# Patient Record
Sex: Female | Born: 1965 | Race: White | Hispanic: No | Marital: Married | Smoking: Former smoker
Health system: Southern US, Community
[De-identification: ages and names within clinical notes are randomized; demographics above are authoritative.]

## PROBLEM LIST (undated history)

## (undated) DIAGNOSIS — K222 Esophageal obstruction: Secondary | ICD-10-CM

## (undated) DIAGNOSIS — D039 Melanoma in situ, unspecified: Secondary | ICD-10-CM

## (undated) DIAGNOSIS — K279 Peptic ulcer, site unspecified, unspecified as acute or chronic, without hemorrhage or perforation: Secondary | ICD-10-CM

## (undated) DIAGNOSIS — K5790 Diverticulosis of intestine, part unspecified, without perforation or abscess without bleeding: Secondary | ICD-10-CM

## (undated) DIAGNOSIS — D229 Melanocytic nevi, unspecified: Secondary | ICD-10-CM

## (undated) DIAGNOSIS — F419 Anxiety disorder, unspecified: Secondary | ICD-10-CM

## (undated) DIAGNOSIS — G4700 Insomnia, unspecified: Secondary | ICD-10-CM

## (undated) DIAGNOSIS — K449 Diaphragmatic hernia without obstruction or gangrene: Secondary | ICD-10-CM

## (undated) DIAGNOSIS — F32A Depression, unspecified: Secondary | ICD-10-CM

## (undated) DIAGNOSIS — K5000 Crohn's disease of small intestine without complications: Secondary | ICD-10-CM

## (undated) DIAGNOSIS — K311 Adult hypertrophic pyloric stenosis: Secondary | ICD-10-CM

## (undated) DIAGNOSIS — F329 Major depressive disorder, single episode, unspecified: Secondary | ICD-10-CM

## (undated) DIAGNOSIS — C439 Malignant melanoma of skin, unspecified: Secondary | ICD-10-CM

## (undated) DIAGNOSIS — K219 Gastro-esophageal reflux disease without esophagitis: Secondary | ICD-10-CM

## (undated) HISTORY — DX: Diverticulosis of intestine, part unspecified, without perforation or abscess without bleeding: K57.90

## (undated) HISTORY — DX: Crohn's disease of small intestine without complications: K50.00

## (undated) HISTORY — DX: Peptic ulcer, site unspecified, unspecified as acute or chronic, without hemorrhage or perforation: K27.9

## (undated) HISTORY — DX: Adult hypertrophic pyloric stenosis: K31.1

## (undated) HISTORY — DX: Diaphragmatic hernia without obstruction or gangrene: K44.9

## (undated) HISTORY — DX: Anxiety disorder, unspecified: F41.9

## (undated) HISTORY — DX: Esophageal obstruction: K22.2

## (undated) HISTORY — PX: TUBAL LIGATION: SHX77

## (undated) HISTORY — DX: Malignant melanoma of skin, unspecified: C43.9

## (undated) HISTORY — PX: CHOLECYSTECTOMY: SHX55

---
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
## 2003-12-19 DIAGNOSIS — D229 Melanocytic nevi, unspecified: Secondary | ICD-10-CM

## 2003-12-19 HISTORY — DX: Melanocytic nevi, unspecified: D22.9

## 2005-06-27 ENCOUNTER — Ambulatory Visit (HOSPITAL_COMMUNITY): Admission: RE | Admit: 2005-06-27 | Discharge: 2005-06-27 | Payer: Self-pay | Admitting: Orthopaedic Surgery

## 2008-07-01 ENCOUNTER — Ambulatory Visit (HOSPITAL_COMMUNITY): Admission: RE | Admit: 2008-07-01 | Discharge: 2008-07-01 | Payer: Self-pay | Admitting: Obstetrics and Gynecology

## 2013-01-23 DIAGNOSIS — K5000 Crohn's disease of small intestine without complications: Secondary | ICD-10-CM | POA: Insufficient documentation

## 2013-01-23 HISTORY — DX: Crohn's disease of small intestine without complications: K50.00

## 2013-02-14 ENCOUNTER — Encounter (HOSPITAL_COMMUNITY): Payer: Self-pay | Admitting: Emergency Medicine

## 2013-02-14 ENCOUNTER — Emergency Department (HOSPITAL_COMMUNITY): Payer: BC Managed Care – PPO

## 2013-02-14 ENCOUNTER — Inpatient Hospital Stay (HOSPITAL_COMMUNITY)
Admission: EM | Admit: 2013-02-14 | Discharge: 2013-02-16 | DRG: 387 | Disposition: A | Discharge status: Home / Self Care | Payer: BC Managed Care – PPO | Attending: Internal Medicine | Admitting: Internal Medicine

## 2013-02-14 DIAGNOSIS — F329 Major depressive disorder, single episode, unspecified: Secondary | ICD-10-CM | POA: Diagnosis present

## 2013-02-14 DIAGNOSIS — F17200 Nicotine dependence, unspecified, uncomplicated: Secondary | ICD-10-CM | POA: Diagnosis present

## 2013-02-14 DIAGNOSIS — N858 Other specified noninflammatory disorders of uterus: Secondary | ICD-10-CM | POA: Diagnosis present

## 2013-02-14 DIAGNOSIS — E876 Hypokalemia: Secondary | ICD-10-CM | POA: Diagnosis present

## 2013-02-14 DIAGNOSIS — K219 Gastro-esophageal reflux disease without esophagitis: Secondary | ICD-10-CM | POA: Diagnosis present

## 2013-02-14 DIAGNOSIS — F411 Generalized anxiety disorder: Secondary | ICD-10-CM | POA: Diagnosis present

## 2013-02-14 DIAGNOSIS — D259 Leiomyoma of uterus, unspecified: Secondary | ICD-10-CM | POA: Diagnosis present

## 2013-02-14 DIAGNOSIS — K529 Noninfective gastroenteritis and colitis, unspecified: Secondary | ICD-10-CM | POA: Diagnosis present

## 2013-02-14 DIAGNOSIS — K5000 Crohn's disease of small intestine without complications: Principal | ICD-10-CM | POA: Diagnosis present

## 2013-02-14 DIAGNOSIS — F3289 Other specified depressive episodes: Secondary | ICD-10-CM | POA: Diagnosis present

## 2013-02-14 DIAGNOSIS — D251 Intramural leiomyoma of uterus: Secondary | ICD-10-CM | POA: Diagnosis present

## 2013-02-14 DIAGNOSIS — K5090 Crohn's disease, unspecified, without complications: Secondary | ICD-10-CM

## 2013-02-14 DIAGNOSIS — K5289 Other specified noninfective gastroenteritis and colitis: Secondary | ICD-10-CM

## 2013-02-14 HISTORY — DX: Major depressive disorder, single episode, unspecified: F32.9

## 2013-02-14 HISTORY — DX: Gastro-esophageal reflux disease without esophagitis: K21.9

## 2013-02-14 HISTORY — DX: Depression, unspecified: F32.A

## 2013-02-14 LAB — URINALYSIS, ROUTINE W REFLEX MICROSCOPIC
Bilirubin Urine: NEGATIVE
Glucose, UA: NEGATIVE mg/dL
Ketones, ur: NEGATIVE mg/dL
Leukocytes, UA: NEGATIVE
Nitrite: NEGATIVE
Urobilinogen, UA: 0.2 mg/dL (ref 0.0–1.0)
pH: 6 (ref 5.0–8.0)

## 2013-02-14 LAB — BASIC METABOLIC PANEL
BUN: 10 mg/dL (ref 6–23)
Creatinine, Ser: 0.9 mg/dL (ref 0.50–1.10)
GFR calc Af Amer: 87 mL/min — ABNORMAL LOW (ref >90)
Potassium: 3.3 mEq/L — ABNORMAL LOW (ref 3.5–5.1)

## 2013-02-14 LAB — CBC
HCT: 37.7 % (ref 36.0–46.0)
MCH: 29.8 pg (ref 26.0–34.0)
WBC: 9.7 10*3/uL (ref 4.0–10.5)

## 2013-02-14 LAB — URINE MICROSCOPIC-ADD ON

## 2013-02-14 MED ORDER — HYDROMORPHONE HCL PF 1 MG/ML IJ SOLN
1.0000 mg | Freq: Once | INTRAMUSCULAR | Status: AC
  Administered 2013-02-14: 1 mg via INTRAVENOUS
  Filled 2013-02-14: qty 1

## 2013-02-14 MED ORDER — IOHEXOL 300 MG/ML  SOLN
100.0000 mL | Freq: Once | INTRAMUSCULAR | Status: AC | PRN
  Administered 2013-02-14: 100 mL via INTRAVENOUS

## 2013-02-14 MED ORDER — IOHEXOL 300 MG/ML  SOLN
50.0000 mL | Freq: Once | INTRAMUSCULAR | Status: AC | PRN
  Administered 2013-02-14: 50 mL via ORAL

## 2013-02-14 MED ORDER — ONDANSETRON HCL 4 MG/2ML IJ SOLN
4.0000 mg | Freq: Once | INTRAMUSCULAR | Status: AC
  Administered 2013-02-14: 4 mg via INTRAVENOUS
  Filled 2013-02-14: qty 2

## 2013-02-14 NOTE — ED Notes (Signed)
Pain in right lower quadrant

## 2013-02-14 NOTE — ED Provider Notes (Signed)
CSN: [id number]     Arrival date & time 02/14/13  1549 History   This chart was scribed for Maudry Diego, MD by Jenne Campus, ED Scribe. This patient was seen in room APA14/APA14 and the patient's care was started at 6:18 PM.   Chief Complaint  Patient presents with  . Abdominal Pain    Patient is a 47 y.o. female presenting with abdominal pain. The history is provided by the patient. No language interpreter was used.  Abdominal Pain Pain location:  RLQ Pain quality: cramping   Pain radiates to:  Anus Onset quality:  Gradual Duration:  3 days Timing:  Intermittent Progression:  Worsening Chronicity:  New Relieved by:  Nothing Worsened by:  Position changes Ineffective treatments:  None tried Associated symptoms: no chest pain, no cough, no diarrhea, no fatigue, no fever, no hematuria and no nausea     HPI Comments: Renee Atkinson is a 47 y.o. female who presents to the Emergency Department complaining of 2 days of intermittent RLQ abdominal pain that radiates to the anus described as spasms. She states that the pain is worsened with changing positions and straining. She denies any associated fevers or nausea. She denies any prior abdominal surgeries. LNMP was 3 years ago.   PCP is Dr. Scotty Court  History reviewed. No pertinent past medical history. Past Surgical History  Procedure Laterality Date  . Cholecystectomy     History reviewed. No pertinent family history. History  Substance Use Topics  . Smoking status: Smoker, Current Status Unknown  . Smokeless tobacco: Not on file  . Alcohol Use: No   No OB history provided.  Review of Systems  Constitutional: Negative for fever, appetite change and fatigue.  HENT: Negative for congestion, ear discharge and sinus pressure.   Eyes: Negative for discharge.  Respiratory: Negative for cough.   Cardiovascular: Negative for chest pain.  Gastrointestinal: Positive for abdominal pain. Negative for nausea and diarrhea.   Genitourinary: Negative for frequency and hematuria.  Musculoskeletal: Negative for back pain.  Skin: Negative for rash.  Neurological: Negative for seizures and headaches.  Psychiatric/Behavioral: Negative for hallucinations.    Allergies  Morphine and related  Home Medications   Current Outpatient Rx  Name  Route  Sig  Dispense  Refill  . citalopram (CELEXA) 40 MG tablet   Oral   Take 40 mg by mouth at bedtime.         . clonazePAM (KLONOPIN) 0.5 MG tablet   Oral   Take 0.5 mg by mouth at bedtime.         Marland Kitchen esomeprazole (NEXIUM) 20 MG capsule   Oral   Take 20 mg by mouth at bedtime.         . fluticasone (FLONASE) 50 MCG/ACT nasal spray   Nasal   Place 2 sprays into the nose daily.          Triage Vitals: BP 115/81  Pulse 79  Temp(Src) 98.3 F (36.8 C) (Oral)  Resp 18  Ht 5' 4"  (1.626 m)  Wt 150 lb (68.04 kg)  BMI 25.73 kg/m2  SpO2 100%  Physical Exam  Nursing note and vitals reviewed. Constitutional: She is oriented to person, place, and time. She appears well-developed and well-nourished.  HENT:  Head: Normocephalic and atraumatic.  Eyes: Conjunctivae and EOM are normal. No scleral icterus.  Neck: Neck supple. No thyromegaly present.  Cardiovascular: Normal rate and regular rhythm.  Exam reveals no gallop and no friction rub.   No murmur  heard. Pulmonary/Chest: Effort normal. No stridor. She has no wheezes. She has no rales. She exhibits no tenderness.  Abdominal: She exhibits no distension. There is tenderness (moderate RLQ tenderness). There is no rebound.  Musculoskeletal: Normal range of motion. She exhibits no edema.  Lymphadenopathy:    She has no cervical adenopathy.  Neurological: She is alert and oriented to person, place, and time. She exhibits normal muscle tone. Coordination normal.  Skin: Skin is warm and dry. No rash noted. No erythema.  Psychiatric: She has a normal mood and affect. Her behavior is normal.    ED Course   Procedures (including critical care time)  Medications  HYDROmorphone (DILAUDID) injection 1 mg (not administered)  ondansetron (ZOFRAN) injection 4 mg (not administered)    DIAGNOSTIC STUDIES: Oxygen Saturation is 100% on room air, normal by my interpretation.    COORDINATION OF CARE: 6:20 PM-Discussed treatment plan which includes medications, CT of abdomen, CBC panel, BMP and UA with pt at bedside and pt agreed to plan.   10:09 PM-Pt rechecked and still complains of pain with medications listed above. Upon re-exam, pt still has tenderness to the RLQ. Informed pt of CT findings showing inflammed intestines and fibroids on the uterus. Discussed admission with pt and pt agreed.  10:13 PM-Consult complete with Dr. Darrick Meigs, Hospitalist. Patient case explained and discussed. Dr. Darrick Meigs agrees to admit patient for further evaluation and treatment. Call ended at 10:14 PM.  Labs Review Labs Reviewed  URINALYSIS, ROUTINE W REFLEX MICROSCOPIC - Abnormal; Notable for the following:    Color, Urine AMBER (*)    APPearance HAZY (*)    Specific Gravity, Urine >1.030 (*)    Hgb urine dipstick SMALL (*)    All other components within normal limits  BASIC METABOLIC PANEL - Abnormal; Notable for the following:    Potassium 3.3 (*)    GFR calc non Af Amer 75 (*)    GFR calc Af Amer 87 (*)    All other components within normal limits  URINE MICROSCOPIC-ADD ON - Abnormal; Notable for the following:    Squamous Epithelial / LPF FEW (*)    All other components within normal limits  CBC   Imaging Review Ct Abdomen Pelvis W Contrast  02/14/2013   CLINICAL DATA:  Abdominal pain  EXAM: CT ABDOMEN AND PELVIS WITH CONTRAST  TECHNIQUE: Multidetector CT imaging of the abdomen and pelvis was performed using the standard protocol following bolus administration of intravenous contrast.  CONTRAST:  82m OMNIPAQUE IOHEXOL 300 MG/ML SOLN, 1098mOMNIPAQUE IOHEXOL 300 MG/ML SOLN  COMPARISON:  None.  FINDINGS: The  terminal ileum is markedly thickened over approximately 15 cm length. Mild infiltration of the surrounding mesenteric fat. There is mild dilatation of the ileum proximal to the thickened loop. Remaining small bowel is not significantly dilated. FINDINGS most compatible with terminal ileitis, most likely Crohn's disease. No abscess is present. No free fluid.  Large irregular low-density mass involving the posterior fundus of the ureters. Endometrium is displaced anteriorly. This measures approximately 13 x 9 cm and is most likely a necrotic fibroid without calcification. This is touching the rectum and the bladder.  Lung bases are clear. Liver is normal. Cholecystectomy clips are present. Bile ducts nondilated. Pancreas spleen kidneys are normal.  IMPRESSION: Thickening of the terminal ileum, most consistent with Crohn's disease. No fistula or abscess is identified.  Large low-density irregular mass involving the uterus most likely a degenerating fibroid. Neoplasm not excluded.   Electronically Signed   By:  Franchot Gallo M.D.   On: 02/14/2013 20:43    EKG Interpretation   None       MDM  No diagnosis found.  The chart was scribed for me under my direct supervision.  I personally performed the history, physical, and medical decision making and all procedures in the evaluation of this patient.Maudry Diego, MD 02/14/13 2223

## 2013-02-14 NOTE — H&P (Signed)
PCP:   No primary provider on file.   Chief Complaint:  Abdominal pain  HPI: 47 year old female who came with two-day history of intermittent right lower quadrant abdominal pain which radiates to the anus and described as spasms. Pain is worsened with changing position and straining. Patient does not have any nausea or vomiting associated with it she denies any fever no history of previous inflammatory bowel disease. She denies chest pain or shortness of breath. She denies dysuria. Patient admits to having diarrhea and had 4 episodes of l loose bowel movements today which are watery she denies any blood in the stool, patient is in menopause, denies any vaginal bleeding. Patient's last menstrual period was 3 years ago Allergies:   Allergies  Allergen Reactions  . Morphine And Related Nausea And Vomiting     History reviewed. No pertinent past medical history.  Past Surgical History  Procedure Laterality Date  . Cholecystectomy      Prior to Admission medications   Medication Sig Start Date End Date Taking? Authorizing Provider  citalopram (CELEXA) 40 MG tablet Take 40 mg by mouth at bedtime.   Yes Historical Provider, MD  clonazePAM (KLONOPIN) 0.5 MG tablet Take 0.5 mg by mouth at bedtime.   Yes Historical Provider, MD  esomeprazole (NEXIUM) 20 MG capsule Take 20 mg by mouth at bedtime.   Yes Historical Provider, MD  fluticasone (FLONASE) 50 MCG/ACT nasal spray Place 2 sprays into the nose daily.   Yes Historical Provider, MD    Social History:  reports that she has been smoking.  She does not have any smokeless tobacco history on file. She reports that she does not drink alcohol. Her drug history is not on file.    All the positives are listed in BOLD  Review of Systems:  HEENT: Headache, blurred vision, runny nose, sore throat Neck: Hypothyroidism, hyperthyroidism,,lymphadenopathy Chest : Shortness of breath, history of COPD, Asthma Heart : Chest pain, history of coronary  arterey disease GI:  Nausea, vomiting, diarrhea, constipation, GERD GU: Dysuria, urgency, frequency of urination, hematuria Neuro: Stroke, seizures, syncope Psych: Depression, anxiety, hallucinations   Physical Exam: Blood pressure 109/74, pulse 78, temperature 98.6 F (37 C), temperature source Oral, resp. rate 20, height 5' 4"  (1.626 m), weight 68.04 kg (150 lb), SpO2 99.00%. Constitutional:   Patient is a well-developed and well-nourished female in no acute distress and cooperative with exam. Head: Normocephalic and atraumatic Mouth: Mucus membranes moist Eyes: PERRL, EOMI, conjunctivae normal Neck: Supple, No Thyromegaly Cardiovascular: RRR, S1 normal, S2 normal Pulmonary/Chest: CTAB, no wheezes, rales, or rhonchi Abdominal: Soft. Positive tenderness on palpation of right lower quadrant, non-distended, bowel sounds are normal, no masses, organomegaly, or guarding present.  Neurological: A&O x3, Strenght is normal and symmetric bilaterally, cranial nerve II-XII are grossly intact, no focal motor deficit, sensory intact to light touch bilaterally.  Extremities : No Cyanosis, Clubbing or Edema   Labs on Admission:  Results for orders placed during the hospital encounter of 02/14/13 (from the past 48 hour(s))  URINALYSIS, ROUTINE W REFLEX MICROSCOPIC     Status: Abnormal   Collection Time    02/14/13  4:06 PM      Result Value Range   Color, Urine AMBER (*) YELLOW   Comment: BIOCHEMICALS MAY BE AFFECTED BY COLOR   APPearance HAZY (*) CLEAR   Specific Gravity, Urine >1.030 (*) 1.005 - 1.030   pH 6.0  5.0 - 8.0   Glucose, UA NEGATIVE  NEGATIVE mg/dL   Hgb urine  dipstick SMALL (*) NEGATIVE   Bilirubin Urine NEGATIVE  NEGATIVE   Ketones, ur NEGATIVE  NEGATIVE mg/dL   Protein, ur NEGATIVE  NEGATIVE mg/dL   Urobilinogen, UA 0.2  0.0 - 1.0 mg/dL   Nitrite NEGATIVE  NEGATIVE   Leukocytes, UA NEGATIVE  NEGATIVE  URINE MICROSCOPIC-ADD ON     Status: Abnormal   Collection Time     02/14/13  4:06 PM      Result Value Range   Squamous Epithelial / LPF FEW (*) RARE   WBC, UA 0-2  <3 WBC/hpf   RBC / HPF 3-6  <3 RBC/hpf  CBC     Status: None   Collection Time    02/14/13  4:29 PM      Result Value Range   WBC 9.7  4.0 - 10.5 K/uL   RBC 4.19  3.87 - 5.11 MIL/uL   Hemoglobin 12.5  12.0 - 15.0 g/dL   HCT 37.7  36.0 - 46.0 %   MCV 90.0  78.0 - 100.0 fL   MCH 29.8  26.0 - 34.0 pg   MCHC 33.2  30.0 - 36.0 g/dL   RDW 14.4  11.5 - 15.5 %   Platelets 285  150 - 400 K/uL  BASIC METABOLIC PANEL     Status: Abnormal   Collection Time    02/14/13  4:29 PM      Result Value Range   Sodium 139  135 - 145 mEq/L   Potassium 3.3 (*) 3.5 - 5.1 mEq/L   Chloride 101  96 - 112 mEq/L   CO2 29  19 - 32 mEq/L   Glucose, Bld 90  70 - 99 mg/dL   BUN 10  6 - 23 mg/dL   Creatinine, Ser 0.90  0.50 - 1.10 mg/dL   Calcium 9.8  8.4 - 10.5 mg/dL   GFR calc non Af Amer 75 (*) >90 mL/min   GFR calc Af Amer 87 (*) >90 mL/min   Comment: (NOTE)     The eGFR has been calculated using the CKD EPI equation.     This calculation has not been validated in all clinical situations.     eGFR's persistently <90 mL/min signify possible Chronic Kidney     Disease.    Radiological Exams on Admission: Ct Abdomen Pelvis W Contrast  02/14/2013   CLINICAL DATA:  Abdominal pain  EXAM: CT ABDOMEN AND PELVIS WITH CONTRAST  TECHNIQUE: Multidetector CT imaging of the abdomen and pelvis was performed using the standard protocol following bolus administration of intravenous contrast.  CONTRAST:  25m OMNIPAQUE IOHEXOL 300 MG/ML SOLN, 1021mOMNIPAQUE IOHEXOL 300 MG/ML SOLN  COMPARISON:  None.  FINDINGS: The terminal ileum is markedly thickened over approximately 15 cm length. Mild infiltration of the surrounding mesenteric fat. There is mild dilatation of the ileum proximal to the thickened loop. Remaining small bowel is not significantly dilated. FINDINGS most compatible with terminal ileitis, most likely Crohn's  disease. No abscess is present. No free fluid.  Large irregular low-density mass involving the posterior fundus of the ureters. Endometrium is displaced anteriorly. This measures approximately 13 x 9 cm and is most likely a necrotic fibroid without calcification. This is touching the rectum and the bladder.  Lung bases are clear. Liver is normal. Cholecystectomy clips are present. Bile ducts nondilated. Pancreas spleen kidneys are normal.  IMPRESSION: Thickening of the terminal ileum, most consistent with Crohn's disease. No fistula or abscess is identified.  Large low-density irregular mass involving the uterus  most likely a degenerating fibroid. Neoplasm not excluded.   Electronically Signed   By: Franchot Gallo M.D.   On: 02/14/2013 20:43    Assessment/Plan Active Problems:   Ileitis   Fibroid uterus  Hypokalemia  47 year old female came with abdominal pain in the right lobe aren't CT of the abdomen showed thickening of the terminal ileum most consistent with Crohn's disease. I will start patient empirically on Cipro and Flagyl for possible inflammation of the ileum. We'll obtain GI consult in the morning for further evaluation for Crohn disease. Antibiotics can be stopped if no infectious etiology suspected as per GI. Due to diarrhea I will also check C. Difficile PCR. There is a large low-density irregular mass involving the uterus which most likely appears a degenerative fibroid, but neoplasm could not be excluded so I will obtain pelvic ultrasound to further assess the mass. Patient might need GYN evaluation as outpatient. We'll replace the potassium  Code status: Presumed full code  Family discussion: Discussed with husband at bedside   Time Spent on Admission: 60 min  [facility] Pager: [phone] 02/14/2013, 11:43 PM  If 7PM-7AM, please contact night-coverage  [email or web address]  Password [email or web address]

## 2013-02-15 ENCOUNTER — Inpatient Hospital Stay (HOSPITAL_COMMUNITY): Payer: BC Managed Care – PPO

## 2013-02-15 ENCOUNTER — Encounter (HOSPITAL_COMMUNITY): Payer: Self-pay | Admitting: *Deleted

## 2013-02-15 DIAGNOSIS — R933 Abnormal findings on diagnostic imaging of other parts of digestive tract: Secondary | ICD-10-CM

## 2013-02-15 DIAGNOSIS — R1011 Right upper quadrant pain: Secondary | ICD-10-CM

## 2013-02-15 LAB — CBC
HCT: 34.7 % — ABNORMAL LOW (ref 36.0–46.0)
Hemoglobin: 11.5 g/dL — ABNORMAL LOW (ref 12.0–15.0)
MCH: 30 pg (ref 26.0–34.0)
MCHC: 33.1 g/dL (ref 30.0–36.0)

## 2013-02-15 LAB — COMPREHENSIVE METABOLIC PANEL
ALT: 11 U/L (ref 0–35)
Albumin: 2.5 g/dL — ABNORMAL LOW (ref 3.5–5.2)
Alkaline Phosphatase: 47 U/L (ref 39–117)
BUN: 6 mg/dL (ref 6–23)
Calcium: 9.2 mg/dL (ref 8.4–10.5)
GFR calc Af Amer: 85 mL/min — ABNORMAL LOW (ref >90)
GFR calc non Af Amer: 73 mL/min — ABNORMAL LOW (ref >90)
Glucose, Bld: 111 mg/dL — ABNORMAL HIGH (ref 70–99)
Sodium: 142 mEq/L (ref 135–145)
Total Protein: 5.4 g/dL — ABNORMAL LOW (ref 6.0–8.3)

## 2013-02-15 LAB — SEDIMENTATION RATE: Sed Rate: 5 mm/hr (ref 0–22)

## 2013-02-15 MED ORDER — OXYCODONE-ACETAMINOPHEN 5-325 MG PO TABS
1.0000 | ORAL_TABLET | Freq: Four times a day (QID) | ORAL | Status: DC | PRN
  Administered 2013-02-15 (×2): 1 via ORAL
  Filled 2013-02-15 (×3): qty 1

## 2013-02-15 MED ORDER — ENOXAPARIN SODIUM 40 MG/0.4ML ~~LOC~~ SOLN: 40.0000 mg | SUBCUTANEOUS | Status: DC

## 2013-02-15 MED ORDER — METRONIDAZOLE IN NACL 5-0.79 MG/ML-% IV SOLN
500.0000 mg | Freq: Three times a day (TID) | INTRAVENOUS | Status: DC
  Filled 2013-02-15 (×9): qty 100

## 2013-02-15 MED ORDER — SODIUM CHLORIDE 0.9 % IV SOLN
INTRAVENOUS | Status: DC
  Administered 2013-02-15 (×2): via INTRAVENOUS

## 2013-02-15 MED ORDER — CITALOPRAM HYDROBROMIDE 20 MG PO TABS
40.0000 mg | ORAL_TABLET | Freq: Every day | ORAL | Status: DC
  Administered 2013-02-15 (×2): 40 mg via ORAL
  Filled 2013-02-15 (×2): qty 2

## 2013-02-15 MED ORDER — POTASSIUM CHLORIDE 10 MEQ/100ML IV SOLN
10.0000 meq | Freq: Once | INTRAVENOUS | Status: AC
  Administered 2013-02-15: 10 meq via INTRAVENOUS
  Filled 2013-02-15: qty 100

## 2013-02-15 MED ORDER — CIPROFLOXACIN IN D5W 400 MG/200ML IV SOLN
INTRAVENOUS | Status: AC
  Filled 2013-02-15: qty 200

## 2013-02-15 MED ORDER — METRONIDAZOLE IN NACL 5-0.79 MG/ML-% IV SOLN
500.0000 mg | Freq: Three times a day (TID) | INTRAVENOUS | Status: DC
  Administered 2013-02-15 – 2013-02-16 (×2): 500 mg via INTRAVENOUS
  Filled 2013-02-15 (×9): qty 100

## 2013-02-15 MED ORDER — METRONIDAZOLE IN NACL 5-0.79 MG/ML-% IV SOLN
500.0000 mg | Freq: Three times a day (TID) | INTRAVENOUS | Status: DC
  Administered 2013-02-15 (×2): 500 mg via INTRAVENOUS
  Filled 2013-02-15 (×11): qty 100

## 2013-02-15 MED ORDER — GADOBENATE DIMEGLUMINE 529 MG/ML IV SOLN
10.0000 mL | Freq: Once | INTRAVENOUS | Status: AC | PRN
  Administered 2013-02-15: 10 mL via INTRAVENOUS

## 2013-02-15 MED ORDER — CLONAZEPAM 0.5 MG PO TABS
0.5000 mg | ORAL_TABLET | Freq: Every day | ORAL | Status: DC
  Administered 2013-02-15 (×2): 0.5 mg via ORAL
  Filled 2013-02-15 (×2): qty 1

## 2013-02-15 MED ORDER — CIPROFLOXACIN IN D5W 400 MG/200ML IV SOLN
400.0000 mg | Freq: Two times a day (BID) | INTRAVENOUS | Status: DC
  Administered 2013-02-15 – 2013-02-16 (×3): 400 mg via INTRAVENOUS
  Filled 2013-02-15 (×8): qty 200

## 2013-02-15 MED ORDER — IBUPROFEN 800 MG PO TABS
400.0000 mg | ORAL_TABLET | Freq: Four times a day (QID) | ORAL | Status: DC | PRN
  Administered 2013-02-15: 400 mg via ORAL
  Filled 2013-02-15: qty 1

## 2013-02-15 MED ORDER — METRONIDAZOLE IN NACL 5-0.79 MG/ML-% IV SOLN
INTRAVENOUS | Status: AC
  Filled 2013-02-15: qty 100

## 2013-02-15 MED ORDER — ACETAMINOPHEN 325 MG PO TABS
650.0000 mg | ORAL_TABLET | Freq: Four times a day (QID) | ORAL | Status: DC | PRN
  Administered 2013-02-15 (×2): 650 mg via ORAL
  Filled 2013-02-15 (×2): qty 2

## 2013-02-15 MED ORDER — HYDROMORPHONE HCL PF 1 MG/ML IJ SOLN
1.0000 mg | INTRAMUSCULAR | Status: DC | PRN
  Administered 2013-02-15: 1 mg via INTRAVENOUS
  Filled 2013-02-15: qty 1

## 2013-02-15 MED ORDER — FLUTICASONE PROPIONATE 50 MCG/ACT NA SUSP
2.0000 | Freq: Every day | NASAL | Status: DC
  Filled 2013-02-15: qty 16

## 2013-02-15 MED ORDER — PANTOPRAZOLE SODIUM 40 MG PO TBEC
40.0000 mg | DELAYED_RELEASE_TABLET | Freq: Every day | ORAL | Status: DC
  Administered 2013-02-15: 40 mg via ORAL
  Filled 2013-02-15: qty 1

## 2013-02-15 MED ORDER — ONDANSETRON HCL 4 MG/2ML IJ SOLN
4.0000 mg | Freq: Four times a day (QID) | INTRAMUSCULAR | Status: DC | PRN
  Administered 2013-02-15 (×2): 4 mg via INTRAVENOUS
  Filled 2013-02-15 (×2): qty 2

## 2013-02-15 NOTE — Progress Notes (Signed)
[facility] PROGRESS NOTE  Renee Atkinson TDV:[id number] DOB: 30-Dec-1965 DOA: 02/14/2013 PCP: No primary provider on file.  Assessment/Plan: 1. Terminal Ileitis -thickening/inflammation of large segment of TI -concerning for IBD like Crohns, infectious ileitis also possibility  -check ESR, Gi pathogen panel -continue empiric Abx-Cipro/Flagyl -start clears, IVF, supportive care -GI consult pending, dr.Fields notified -will need colonoscopy/biopsy of TI at some point  2. Large irregular uterine mass, likely fibroid noted on CT -will need outpatient GYN eval for this  3. Depression/Anxiety -continue Klonopin/celexa  Code Status: Full Family Communication: d/w husband at bedside Disposition Plan: home when improved   Consultants:  GI Dr.Fields  Antibiotics:  Cipro/FLagyl 10/23  HPI/Subjective: Pain improved, still having diarrhea, non bloody  Objective: Filed Vitals:   02/15/13 0519  BP: 128/87  Pulse: 84  Temp: 98.3 F (36.8 C)  Resp: 20    Intake/Output Summary (Last 24 hours) at 02/15/13 1100 Last data filed at 02/15/13 0519  Gross per 24 hour  Intake      0 ml  Output      0 ml  Net      0 ml   Filed Weights   02/14/13 1602 02/15/13 0028  Weight: 68.04 kg (150 lb) 55.4 kg (122 lb 2.2 oz)    Exam:   General: AAOx3, no distress  Cardiovascular: S1S2/RRR  Respiratory: CTAB  Abdomen: soft, slightly distended, BS present, non tender  Musculoskeletal: no edema c/c  Skin no rashes   Data Reviewed: Basic Metabolic Panel:  Recent Labs Lab 02/14/13 1629 02/15/13 0458  NA 139 142  K 3.3* 4.1  CL 101 107  CO2 29 31  GLUCOSE 90 111*  BUN 10 6  CREATININE 0.90 0.92  CALCIUM 9.8 9.2   Liver Function Tests:  Recent Labs Lab 02/15/13 0458  AST 13  ALT 11  ALKPHOS 47  BILITOT 0.4  PROT 5.4*  ALBUMIN 2.5*   No results found for this basename: LIPASE, AMYLASE,  in the last 168 hours No results found for this basename: AMMONIA,   in the last 168 hours CBC:  Recent Labs Lab 02/14/13 1629 02/15/13 0458  WBC 9.7 7.2  HGB 12.5 11.5*  HCT 37.7 34.7*  MCV 90.0 90.6  PLT 285 254   Cardiac Enzymes: No results found for this basename: CKTOTAL, CKMB, CKMBINDEX, TROPONINI,  in the last 168 hours BNP (last 3 results) No results found for this basename: PROBNP,  in the last 8760 hours CBG: No results found for this basename: GLUCAP,  in the last 168 hours  Recent Results (from the past 240 hour(s))  CLOSTRIDIUM DIFFICILE BY PCR     Status: None   Collection Time    02/15/13  2:52 AM      Result Value Range Status   C difficile by pcr NEGATIVE  NEGATIVE Final     Studies: Ct Abdomen Pelvis W Contrast  02/14/2013   CLINICAL DATA:  Abdominal pain  EXAM: CT ABDOMEN AND PELVIS WITH CONTRAST  TECHNIQUE: Multidetector CT imaging of the abdomen and pelvis was performed using the standard protocol following bolus administration of intravenous contrast.  CONTRAST:  47m OMNIPAQUE IOHEXOL 300 MG/ML SOLN, 1073mOMNIPAQUE IOHEXOL 300 MG/ML SOLN  COMPARISON:  None.  FINDINGS: The terminal ileum is markedly thickened over approximately 15 cm length. Mild infiltration of the surrounding mesenteric fat. There is mild dilatation of the ileum proximal to the thickened loop. Remaining small bowel is not significantly dilated. FINDINGS most compatible with terminal ileitis,  most likely Crohn's disease. No abscess is present. No free fluid.  Large irregular low-density mass involving the posterior fundus of the ureters. Endometrium is displaced anteriorly. This measures approximately 13 x 9 cm and is most likely a necrotic fibroid without calcification. This is touching the rectum and the bladder.  Lung bases are clear. Liver is normal. Cholecystectomy clips are present. Bile ducts nondilated. Pancreas spleen kidneys are normal.  IMPRESSION: Thickening of the terminal ileum, most consistent with Crohn's disease. No fistula or abscess is  identified.  Large low-density irregular mass involving the uterus most likely a degenerating fibroid. Neoplasm not excluded.   Electronically Signed   By: Franchot Gallo M.D.   On: 02/14/2013 20:43    Scheduled Meds: . ciprofloxacin  400 mg Intravenous Q12H  . citalopram  40 mg Oral QHS  . clonazePAM  0.5 mg Oral QHS  . fluticasone  2 spray Each Nare Daily  . metronidazole  500 mg Intravenous Q8H   Continuous Infusions: . sodium chloride 75 mL/hr at 02/15/13 0308    Active Problems:   Ileitis   Fibroid uterus    Time spent: 22mn    [facility] Pager [phone] 7PM-7AM, please contact night-coverage at [email or web address], password [email or web address]10/24/2014, 11:00 AM  LOS: 1 day

## 2013-02-15 NOTE — Consult Note (Signed)
Referring Provider: Kathie Dike, MD Primary Care Physician:  No primary provider on file. Primary Gastroenterologist:  Barney Drain, MD  Reason for Consultation:  ?crohn's disease  HPI: Renee Atkinson is a 47 y.o. female present with two day h/o colicky RLQ pain. Developed diarrhea just prior to admission. 10+ watery nonbloody stools in the last 24 hours. Severe pain radiating from lower abdomen into anus with BMs. Denies fever, N/V.  Denies GI symptoms prior to this episode. Generally has normal BMs. Chronic GERD well-controlled. May 2014, sick with N/V/D while on cruise. Symptoms resolved without intervention. No prior colonoscopy. EGD OK, [facility], 10 years ago.  CT suspicious for Crohn's disease, send findings below. C.Diff PCR negative X 1. GI Pathogen panel pending.   Prior to Admission medications   Medication Sig Start Date End Date Taking? Authorizing Provider  citalopram (CELEXA) 40 MG tablet Take 40 mg by mouth at bedtime.   Yes Historical Provider, MD  clonazePAM (KLONOPIN) 0.5 MG tablet Take 0.5 mg by mouth at bedtime.   Yes Historical Provider, MD  esomeprazole (NEXIUM) 20 MG capsule Take 20 mg by mouth at bedtime.   Yes Historical Provider, MD  fluticasone (FLONASE) 50 MCG/ACT nasal spray Place 2 sprays into the nose daily.   Yes Historical Provider, MD    Current Facility-Administered Medications  Medication Dose Route Frequency Provider Last Rate Last Dose  . 0.9 %  sodium chloride infusion   Intravenous Continuous Oswald Hillock, MD 75 mL/hr at 02/15/13 0308    . acetaminophen (TYLENOL) tablet 650 mg  650 mg Oral Q6H PRN Oswald Hillock, MD   650 mg at 02/15/13 0946  . ciprofloxacin (CIPRO) IVPB 400 mg  400 mg Intravenous Q12H Oswald Hillock, MD   400 mg at 02/15/13 1226  . citalopram (CELEXA) tablet 40 mg  40 mg Oral QHS Oswald Hillock, MD   40 mg at 02/15/13 0120  . clonazePAM (KLONOPIN) tablet 0.5 mg  0.5 mg Oral QHS Oswald Hillock, MD   0.5 mg at 02/15/13 0120  . fluticasone  (FLONASE) 50 MCG/ACT nasal spray 2 spray  2 spray Each Nare Daily Oswald Hillock, MD      . HYDROmorphone (DILAUDID) injection 1 mg  1 mg Intravenous Q4H PRN Oswald Hillock, MD   1 mg at 02/15/13 0514  . ibuprofen (ADVIL,MOTRIN) tablet 400 mg  400 mg Oral Q6H PRN Domenic Polite, MD   400 mg at 02/15/13 1134  . metroNIDAZOLE (FLAGYL) IVPB 500 mg  500 mg Intravenous Q8H Oswald Hillock, MD   500 mg at 02/15/13 1136  . ondansetron (ZOFRAN) injection 4 mg  4 mg Intravenous Q6H PRN Oswald Hillock, MD   4 mg at 02/15/13 1133    Allergies as of 02/14/2013 - Review Complete 02/14/2013  Allergen Reaction Noted  . Morphine and related Nausea And Vomiting 02/14/2013    Past Medical History  Diagnosis Date  . Depression   . GERD (gastroesophageal reflux disease)     Past Surgical History  Procedure Laterality Date  . Cholecystectomy      Family History  Problem Relation Age of Onset  . Inflammatory bowel disease Neg Hx   . Colon cancer Neg Hx   . Prostate cancer Father   . Diverticulitis Mother     complicated, colostomy bag temporarily    History   Social History  . Marital Status: Married    Spouse Name: N/A    Number of Children:  2  . Years of Education: N/A   Occupational History  . Not on file.   Social History Main Topics  . Smoking status: Smoker, Current Status Unknown -- 0.50 packs/day    Types: Cigarettes  . Smokeless tobacco: Not on file  . Alcohol Use: No  . Drug Use: No  . Sexual Activity: Not on file   Other Topics Concern  . Not on file   Social History Narrative  . No narrative on file     ROS:  General: Negative for anorexia, weight loss, fever, chills, fatigue, weakness. Eyes: Negative for vision changes.  ENT: Negative for hoarseness, difficulty swallowing , nasal congestion. CV: Negative for chest pain, angina, palpitations, dyspnea on exertion, peripheral edema.  Respiratory: Negative for dyspnea at rest, dyspnea on exertion, cough, sputum, wheezing.   GI: See history of present illness. GU:  Negative for dysuria, hematuria, urinary incontinence, urinary frequency, nocturnal urination.  MS: Negative for joint pain, low back pain.  Derm: Negative for rash or itching.  Neuro: Negative for weakness, abnormal sensation, seizure, frequent headaches, memory loss, confusion.  Psych: Negative for anxiety, depression, suicidal ideation, hallucinations.  Endo: Negative for unusual weight change.  Heme: Negative for bruising or bleeding. Allergy: Negative for rash or hives.       Physical Examination: Vital signs in last 24 hours: Temp:  [98.3 F (36.8 C)-98.8 F (37.1 C)] 98.3 F (36.8 C) (10/24 0519) Pulse Rate:  [78-85] 84 (10/24 0519) Resp:  [18-20] 20 (10/24 0519) BP: (109-131)/(74-87) 128/87 mmHg (10/24 0519) SpO2:  [97 %-100 %] 98 % (10/24 0519) Weight:  [122 lb 2.2 oz (55.4 kg)-150 lb (68.04 kg)] 122 lb 2.2 oz (55.4 kg) (10/24 0028) Last BM Date: 02/15/13  General: Well-nourished, well-developed in no acute distress. Accompanied by daughter and husband.  Head: Normocephalic, atraumatic.   Eyes: Conjunctiva pink, no icterus. Mouth: Oropharyngeal mucosa moist and pink , no lesions erythema or exudate. Neck: Supple without thyromegaly, masses, or lymphadenopathy.  Lungs: Clear to auscultation bilaterally.  Heart: Regular rate and rhythm, no murmurs rubs or gallops.  Abdomen: Bowel sounds are normal, RLQ tenderness moderate, nondistended, no hepatosplenomegaly or masses, no abdominal bruits or    hernia , no rebound or guarding.   Rectal: not peformed Extremities: No lower extremity edema, clubbing, deformity.  Neuro: Alert and oriented x 4 , grossly normal neurologically.  Skin: Warm and dry, no rash or jaundice.   Psych: Alert and cooperative, normal mood and affect.        Intake/Output from previous day:   Intake/Output this shift: Total I/O In: 100 [IV Piggyback:100] Out: -   Lab Results: CBC  Recent Labs   02/14/13 1629 02/15/13 0458  WBC 9.7 7.2  HGB 12.5 11.5*  HCT 37.7 34.7*  MCV 90.0 90.6  PLT 285 254   BMET  Recent Labs  02/14/13 1629 02/15/13 0458  NA 139 142  K 3.3* 4.1  CL 101 107  CO2 29 31  GLUCOSE 90 111*  BUN 10 6  CREATININE 0.90 0.92  CALCIUM 9.8 9.2   LFT  Recent Labs  02/15/13 0458  BILITOT 0.4  ALKPHOS 47  AST 13  ALT 11  PROT 5.4*  ALBUMIN 2.5*   CDiff PCR negative.  ESR 5  PT/INR No results found for this basename: LABPROT, INR,  in the last 72 hours    Imaging Studies: Ct Abdomen Pelvis W Contrast  02/14/2013   CLINICAL DATA:  Abdominal pain  EXAM: CT  ABDOMEN AND PELVIS WITH CONTRAST  TECHNIQUE: Multidetector CT imaging of the abdomen and pelvis was performed using the standard protocol following bolus administration of intravenous contrast.  CONTRAST:  19m OMNIPAQUE IOHEXOL 300 MG/ML SOLN, 1045mOMNIPAQUE IOHEXOL 300 MG/ML SOLN  COMPARISON:  None.  FINDINGS: The terminal ileum is markedly thickened over approximately 15 cm length. Mild infiltration of the surrounding mesenteric fat. There is mild dilatation of the ileum proximal to the thickened loop. Remaining small bowel is not significantly dilated. FINDINGS most compatible with terminal ileitis, most likely Crohn's disease. No abscess is present. No free fluid.  Large irregular low-density mass involving the posterior fundus of the ureters. Endometrium is displaced anteriorly. This measures approximately 13 x 9 cm and is most likely a necrotic fibroid without calcification. This is touching the rectum and the bladder.  Lung bases are clear. Liver is normal. Cholecystectomy clips are present. Bile ducts nondilated. Pancreas spleen kidneys are normal.  IMPRESSION: Thickening of the terminal ileum, most consistent with Crohn's disease. No fistula or abscess is identified.  Large low-density irregular mass involving the uterus most likely a degenerating fibroid. Neoplasm not excluded.   Electronically  Signed   By: ChFranchot Gallo.D.   On: 02/14/2013 20:43  [4 week]   Impression: [age]/o female with acute onset RLQ pain associated with nonbloody diarrhea. CT showed markedly thickened terminal ileum over 15 cm length with mild infiltration of surrounding mesenteric fat, mild dilatation of ileum proximal to this loop. Findings may be secondary to Crohn's but given acuity of symptoms, infectious etiology does need to be excluded.   Plan: 1. Agree with Cipro/Flagyl. 2. Await GI pathogen panel. 3. If patient fails to improve, consider inpatient colonoscopy with terminal ileoscopy. 4. If patient improves with antibiotic therapy, she will need outpatient colonoscopy at a later date. 5. She complains of headache, ?due to dilaudid. Trial of percocet.  I would like to thank Dr. MeRoderic Palauor allowing us[place]oreao take part in the care of this nice patient.    LOS: 1 day   LeNeil Crouch10/24/2014, 1:29 PM

## 2013-02-15 NOTE — Care Management Note (Signed)
    Page 1 of 1   02/15/2013     1:40:30 PM   CARE MANAGEMENT NOTE 02/15/2013  Patient:  Renee Atkinson, Renee Atkinson   Account Number:  [id number]  Date Initiated:  02/15/2013  Documentation initiated by:  Theophilus Kinds  Subjective/Objective Assessment:   Pt admitted from home with ileitis. Pt lives with her husband and will return home at discharge. Pt is independent with ADL's.     Action/Plan:   No CM needs noted.   Anticipated DC Date:  02/18/2013   Anticipated DC Plan:  [place]  CM consult      Choice offered to / List presented to:             Status of service:  Completed, signed off Medicare Important Message given?   (If response is "NO", the following Medicare IM given date fields will be blank) Date Medicare IM given:   Date Additional Medicare IM given:    Discharge Disposition:  HOME/SELF CARE  Per UR Regulation:    If discussed at Long Length of Stay Meetings, dates discussed:    Comments:  02/15/13 [place], RN BSN CM

## 2013-02-15 NOTE — Progress Notes (Signed)
02/15/13 1620 Notified Dr. Roderic Palau of pelvic ultrasound results. Follow-up MRI ordered per Dr. [name]. Donavan Foil, RN

## 2013-02-15 NOTE — Progress Notes (Signed)
UR chart review completed.  

## 2013-02-15 NOTE — Progress Notes (Signed)
02/15/13 1237 Late entry for 1050. Patient c/o persistent headache, rated 8/10 on scale of 0-10 after receiving tylenol 650 mg po PRN for pain this morning. Notified Dr. Broadus John, order placed for ibuprofen PRN for pain. Patient states pain reduced to 6/10 on scale of 0-10 after ibuprofen. Tolerating clear liquids well at this time. No c/o abdominal pain or nausea at this time. Donavan Foil, RN

## 2013-02-15 NOTE — Progress Notes (Signed)
02/15/13 1848 patient aware of need for stool specimen for GI pathogen PCR ordered. Instructed to notify nursing staff of any further stools this morning. Stated would notify. No further stools this afternoon for specimen to be obtained. Nursing staff to monitor and send when available. Donavan Foil, RN

## 2013-02-15 NOTE — Consult Note (Addendum)
REVIEWED. AGREE. U/S-CYSTIC MASS. CT: THICKENED ILEUM/PELVIC MASS/NECROTIC LN ON CT NEAR ILEUM. AWAIT MRI PELVIS. ADVANCE DIET.

## 2013-02-16 DIAGNOSIS — N858 Other specified noninflammatory disorders of uterus: Secondary | ICD-10-CM | POA: Diagnosis present

## 2013-02-16 DIAGNOSIS — N9489 Other specified conditions associated with female genital organs and menstrual cycle: Secondary | ICD-10-CM

## 2013-02-16 LAB — CBC
Hemoglobin: 11.7 g/dL — ABNORMAL LOW (ref 12.0–15.0)
MCH: 30.1 pg (ref 26.0–34.0)
MCHC: 33.2 g/dL (ref 30.0–36.0)
RBC: 3.89 MIL/uL (ref 3.87–5.11)
RDW: 14.2 % (ref 11.5–15.5)

## 2013-02-16 LAB — BASIC METABOLIC PANEL
CO2: 30 mEq/L (ref 19–32)
GFR calc non Af Amer: 78 mL/min — ABNORMAL LOW (ref >90)
Glucose, Bld: 95 mg/dL (ref 70–99)
Potassium: 3.8 mEq/L (ref 3.5–5.1)
Sodium: 141 mEq/L (ref 135–145)

## 2013-02-16 MED ORDER — CIPROFLOXACIN HCL 500 MG PO TABS: 500.0000 mg | ORAL_TABLET | Freq: Two times a day (BID) | ORAL | Status: DC

## 2013-02-16 MED ORDER — METRONIDAZOLE 500 MG PO TABS: 500.0000 mg | ORAL_TABLET | Freq: Three times a day (TID) | ORAL | Status: DC

## 2013-02-16 MED ORDER — PROMETHAZINE HCL 25 MG/ML IJ SOLN
12.5000 mg | Freq: Four times a day (QID) | INTRAMUSCULAR | Status: DC | PRN
  Administered 2013-02-16: 12.5 mg via INTRAVENOUS
  Filled 2013-02-16: qty 1

## 2013-02-16 NOTE — Progress Notes (Signed)
[facility] PROGRESS NOTE  Renee Atkinson LKG:[id number] DOB: 1965-09-24 DOA: 02/14/2013 PCP: No primary provider on file.  Assessment/Plan: 1. Terminal Ileitis -thickening/inflammation of large segment of TI -concerning for IBD like Crohns, infectious ileitis also possibility  -ESR-5, Gi pathogen panel pending -clinically improved on Cipro/Flagyl -tolerating diet -will DC home today to FU with Gi Dr.FIelds for outpatient colonoscopy and terminal Ileoscopy -DC home today on PO CIpro/Flagyl pending GI eval  2. Large irregular uterine mass, most likely fibroid noted on CT -she will FU with GYN  3. Depression/Anxiety -continue Klonopin/celexa  Code Status: Full Family Communication: d/w husband at bedside Disposition Plan: home when improved   Consultants:  GI Dr.Fields  Antibiotics:  Cipro/FLagyl 10/23  HPI/Subjective: Pain resolved, no further diarrhea, tolerating diet  Objective: Filed Vitals:   02/16/13 0525  BP: 129/89  Pulse: 70  Temp: 98.2 F (36.8 C)  Resp: 18    Intake/Output Summary (Last 24 hours) at 02/16/13 1041 Last data filed at 02/16/13 0525  Gross per 24 hour  Intake   1810 ml  Output      0 ml  Net   1810 ml   Filed Weights   02/14/13 1602 02/15/13 0028  Weight: 68.04 kg (150 lb) 55.4 kg (122 lb 2.2 oz)    Exam:   General: AAOx3, no distress  Cardiovascular: S1S2/RRR  Respiratory: CTAB  Abdomen: soft, slightly distended, BS present, non tender  Musculoskeletal: no edema c/c  Skin no rashes   Data Reviewed: Basic Metabolic Panel:  Recent Labs Lab 02/14/13 1629 02/15/13 0458 02/16/13 0607  NA 139 142 141  K 3.3* 4.1 3.8  CL 101 107 105  CO2 29 31 30   GLUCOSE 90 111* 95  BUN 10 6 6   CREATININE 0.90 0.92 0.87  CALCIUM 9.8 9.2 9.1   Liver Function Tests:  Recent Labs Lab 02/15/13 0458  AST 13  ALT 11  ALKPHOS 47  BILITOT 0.4  PROT 5.4*  ALBUMIN 2.5*   No results found for this basename: LIPASE,  AMYLASE,  in the last 168 hours No results found for this basename: AMMONIA,  in the last 168 hours CBC:  Recent Labs Lab 02/14/13 1629 02/15/13 0458 02/16/13 0607  WBC 9.7 7.2 8.1  HGB 12.5 11.5* 11.7*  HCT 37.7 34.7* 35.2*  MCV 90.0 90.6 90.5  PLT 285 254 242   Cardiac Enzymes: No results found for this basename: CKTOTAL, CKMB, CKMBINDEX, TROPONINI,  in the last 168 hours BNP (last 3 results) No results found for this basename: PROBNP,  in the last 8760 hours CBG: No results found for this basename: GLUCAP,  in the last 168 hours  Recent Results (from the past 240 hour(s))  CLOSTRIDIUM DIFFICILE BY PCR     Status: None   Collection Time    02/15/13  2:52 AM      Result Value Range Status   C difficile by pcr NEGATIVE  NEGATIVE Final     Studies: Mr Pelvis W Wo Contrast  02/16/2013   CLINICAL DATA:  Cystic mass along the posterior uterus.  EXAM: MRI PELVIS WITHOUT AND WITH CONTRAST  TECHNIQUE: Multiplanar multisequence MR imaging of the pelvis was performed both before and after administration of intravenous contrast.  CONTRAST:  90m MULTIHANCE GADOBENATE DIMEGLUMINE 529 MG/ML IV SOLN  COMPARISON:  Multiple exams, including 02/15/2013  FINDINGS: 11.1 x 8.9 by 8.3 cm mass of the posterior margin of the uterus is observed. Given the appearance of myometrium extending along the  upper margin of this mass on image 16 of series 2, I suspect that the mass arises from the uterus and probably represents a very large and centrally necrotic fibroid. There is marginal soft tissue enhancement and mural irregularity along the inner margin of this necrotic mass.  A structure believed to represent the left ovary measures 2.1 by 1.1 by 2.2 cm on image 12 of series 3, and is separate from the mass. I suspect that the left ovary is just below the cecum, measuring about 2.5 x 1.6 by 1.2 cm.  There is abnormal fluid in the right pericolic gutter along with a thick-walled appearance of the terminal  ileum on coronal images.  Diffuse disk bulge with central annular tear at L5.  Urinary bladder unremarkable.  IMPRESSION: 1. Large necrotic mass along the posterior uterine margin. Statistically this is most likely to be a large predominantly necrotic intramural fibroid. On prior CT scan from 2006 per [facility], no report of a uterine abnormality is identified. Resection should be considered given its size and irregular enhancing margins to be sure that it represents a benign process. This appears to be separate from the ovaries and accordingly is unlikely to represent an ovarian malignancy. 2. Free fluid in the right pericolic gutter and trauma with inflamed appearing terminal ileum, query Crohn's disease.   Electronically Signed   By: Sherryl Barters M.D.   On: 02/16/2013 09:07   US Transvaginal Non-ob  02/15/2013   CLINICAL DATA:  Evaluate uterine mass.  Distended abdomen.  EXAM: TRANSABDOMINAL AND TRANSVAGINAL ULTRASOUND OF PELVIS  TECHNIQUE: Both transabdominal and transvaginal ultrasound examinations of the pelvis were performed. Transabdominal technique was performed for global imaging of the pelvis including uterus, ovaries, adnexal regions, and pelvic cul-de-sac. It was necessary to proceed with endovaginal exam following the transabdominal exam to visualize the uterus, endometrium and ovaries.  COMPARISON:  CT 02/14/2013  FINDINGS: Uterus  Measurements: A 15.4 x 9.7 x 9.2 cm. There is a complex markedly septated cystic mass noted along the posterior aspect of the uterus. This measures 11.0 x 7.3 x 8.6 cm. This appears to be within the posterior myometrium. Alternatively, this could be a large ovarian cystic mass displacing the uterus  Endometrium  Thickness: Displaced by the posterior mass, 4 mm in thickness. Limited visualization.  Right ovary  Measurements: Not visualized.  Left ovary  Measurements: Not visualized.  Other findings  Trace free fluid in the cul-de-sac  IMPRESSION: Large,  severely complex cystic mass in the region of the posterior uterus. This measures up to 11.0 cm. Extensive internal septations. Differential considerations would include a large degenerating fibroid, while the degree of septation is unusual for this. Also, this could be a large ovarian cystic mass/ neoplasm which is displacing the uterus anteriorly. This should be further evaluated with an MRI of the pelvis without and with contrast.   Electronically Signed   By: Rolm Baptise M.D.   On: 02/15/2013 15:33   US Pelvis Complete  02/15/2013   CLINICAL DATA:  Evaluate uterine mass.  Distended abdomen.  EXAM: TRANSABDOMINAL AND TRANSVAGINAL ULTRASOUND OF PELVIS  TECHNIQUE: Both transabdominal and transvaginal ultrasound examinations of the pelvis were performed. Transabdominal technique was performed for global imaging of the pelvis including uterus, ovaries, adnexal regions, and pelvic cul-de-sac. It was necessary to proceed with endovaginal exam following the transabdominal exam to visualize the uterus, endometrium and ovaries.  COMPARISON:  CT 02/14/2013  FINDINGS: Uterus  Measurements: A 15.4 x 9.7 x 9.2 cm.  There is a complex markedly septated cystic mass noted along the posterior aspect of the uterus. This measures 11.0 x 7.3 x 8.6 cm. This appears to be within the posterior myometrium. Alternatively, this could be a large ovarian cystic mass displacing the uterus  Endometrium  Thickness: Displaced by the posterior mass, 4 mm in thickness. Limited visualization.  Right ovary  Measurements: Not visualized.  Left ovary  Measurements: Not visualized.  Other findings  Trace free fluid in the cul-de-sac  IMPRESSION: Large, severely complex cystic mass in the region of the posterior uterus. This measures up to 11.0 cm. Extensive internal septations. Differential considerations would include a large degenerating fibroid, while the degree of septation is unusual for this. Also, this could be a large ovarian cystic mass/  neoplasm which is displacing the uterus anteriorly. This should be further evaluated with an MRI of the pelvis without and with contrast.   Electronically Signed   By: Rolm Baptise M.D.   On: 02/15/2013 15:33   Ct Abdomen Pelvis W Contrast  02/14/2013   CLINICAL DATA:  Abdominal pain  EXAM: CT ABDOMEN AND PELVIS WITH CONTRAST  TECHNIQUE: Multidetector CT imaging of the abdomen and pelvis was performed using the standard protocol following bolus administration of intravenous contrast.  CONTRAST:  49m OMNIPAQUE IOHEXOL 300 MG/ML SOLN, 1049mOMNIPAQUE IOHEXOL 300 MG/ML SOLN  COMPARISON:  None.  FINDINGS: The terminal ileum is markedly thickened over approximately 15 cm length. Mild infiltration of the surrounding mesenteric fat. There is mild dilatation of the ileum proximal to the thickened loop. Remaining small bowel is not significantly dilated. FINDINGS most compatible with terminal ileitis, most likely Crohn's disease. No abscess is present. No free fluid.  Large irregular low-density mass involving the posterior fundus of the ureters. Endometrium is displaced anteriorly. This measures approximately 13 x 9 cm and is most likely a necrotic fibroid without calcification. This is touching the rectum and the bladder.  Lung bases are clear. Liver is normal. Cholecystectomy clips are present. Bile ducts nondilated. Pancreas spleen kidneys are normal.  IMPRESSION: Thickening of the terminal ileum, most consistent with Crohn's disease. No fistula or abscess is identified.  Large low-density irregular mass involving the uterus most likely a degenerating fibroid. Neoplasm not excluded.   Electronically Signed   By: ChFranchot Gallo.D.   On: 02/14/2013 20:43    Scheduled Meds: . ciprofloxacin  400 mg Intravenous Q12H  . citalopram  40 mg Oral QHS  . clonazePAM  0.5 mg Oral QHS  . fluticasone  2 spray Each Nare Daily  . metronidazole  500 mg Intravenous Q8H  . pantoprazole  40 mg Oral QAC supper   Continuous  Infusions: . sodium chloride 75 mL/hr at 02/15/13 2256    Active Problems:   Ileitis   Fibroid uterus    Time spent: 2588m   Ashland Wiseman  [facility] Pager [phone] 7PM-7AM, please contact night-coverage at [email or web address], password [email or web address]/25/2014, 10:41 AM  LOS: 2 days

## 2013-02-16 NOTE — Discharge Summary (Signed)
Physician Discharge Summary  TYJAE SHVARTSMAN JZP:[id number] DOB: 03-Dec-1965 DOA: 02/14/2013  PCP: No primary provider on file.  Admit date: 02/14/2013 Discharge date: 02/16/2013  Time spent: 15mnutes  Recommendations for Outpatient Follow-up:  1. Dr.Sandi Fields, GI in 2 weeks 2. Outpatient Colonoscopy and terminal ileoscopy with Biopsy Fu with GYN in 2-3weeks for Uterine Mass   Discharge Diagnoses:    Ileitis   Uterine mass   Anxiety  Depression  Discharge Condition: stable  Diet recommendation: regular  Filed Weights   02/14/13 1602 02/15/13 0028  Weight: 68.04 kg (150 lb) 55.4 kg (122 lb 2.2 oz)    History of present illness:  47year old female who came with two-day history of intermittent right lower quadrant abdominal pain which radiates to the anus and described as spasms. Pain is worsened with changing position and straining. Patient does not have any nausea or vomiting associated with it she denies any fever no history of previous inflammatory bowel disease. She denies chest pain or shortness of breath. She denies dysuria.  Patient admits to having diarrhea and had 4 episodes of l loose bowel movements today which are watery she denies any blood in the stool, patient is in menopause, denies any vaginal bleeding. Patient's last menstrual period was 3 years ago  Hospital Course:  1. Terminal Ileitis -thickening/inflammation of large segment of TI noted on Ct abd -concerning for IBD like Crohns, infectious ileitis also possible given quick improvement -ESR-5, Gi pathogen panel pending  -clinically improved on Cipro/Flagyl  -tolerating diet  -will DC home today on Po Cipro/Flagyl to FU with Gi Dr.FIelds for outpatient colonoscopy and terminal Ileoscopy   2. 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 -She is strongly advised to FU with GYn for further eval to rule out malignancy -she will FU with GYN   3. Depression/Anxiety  -continue Klonopin/Celexa   Consultations:  Gi dr.Fields  Discharge Exam: Filed Vitals:   02/16/13 0525  BP: 129/89  Pulse: 70  Temp: 98.2 F (36.8 C)  Resp: 18    General: AAOx3, no distress Cardiovascular: S1S2/RRR Respiratory: CTAB  Discharge Instructions  Discharge Orders   Future Orders Complete By Expires   Discharge instructions  As directed    Comments:     Bland diet, advance as tolerated   Increase activity slowly  As directed        Medication List         ciprofloxacin 500 MG tablet  Commonly known as:  CIPRO  Take 1 tablet (500 mg total) by mouth 2 (two) times daily. For 8 days     citalopram 40 MG tablet  Commonly known as:  CELEXA  Take 40 mg by mouth at bedtime.     clonazePAM 0.5 MG tablet  Commonly known as:  KLONOPIN  Take 0.5 mg by mouth at bedtime.     esomeprazole 20 MG capsule  Commonly known as:  NEXIUM  Take 20 mg by mouth at bedtime.     fluticasone 50 MCG/ACT nasal spray  Commonly known as:  FLONASE  Place 2 sprays into the nose daily.     metroNIDAZOLE 500 MG tablet  Commonly known as:  FLAGYL  Take 1 tablet (500 mg total) by mouth 3 (three) times daily. For 8 days  Allergies  Allergen Reactions  . Morphine And Related Nausea And Vomiting       Follow-up Information   Follow up with Barney Drain, MD. Schedule an appointment as soon as possible for a visit in 2 weeks.   Specialty:  Gastroenterology   Contact information:   [facility] [address] [place] [address] [phone]       Follow up with Gynecologist . Schedule an appointment as soon as possible for a visit in 1 month.       The results of significant diagnostics from this hospitalization (including imaging, microbiology,  ancillary and laboratory) are listed below for reference.    Significant Diagnostic Studies: Mr Pelvis W Wo Contrast  02/16/2013   CLINICAL DATA:  Cystic mass along the posterior uterus.  EXAM: MRI PELVIS WITHOUT AND WITH CONTRAST  TECHNIQUE: Multiplanar multisequence MR imaging of the pelvis was performed both before and after administration of intravenous contrast.  CONTRAST:  65m MULTIHANCE GADOBENATE DIMEGLUMINE 529 MG/ML IV SOLN  COMPARISON:  Multiple exams, including 02/15/2013  FINDINGS: 11.1 x 8.9 by 8.3 cm mass of the posterior margin of the uterus is observed. Given the appearance of myometrium extending along the upper margin of this mass on image 16 of series 2, I suspect that the mass arises from the uterus and probably represents a very large and centrally necrotic fibroid. There is marginal soft tissue enhancement and mural irregularity along the inner margin of this necrotic mass.  A structure believed to represent the left ovary measures 2.1 by 1.1 by 2.2 cm on image 12 of series 3, and is separate from the mass. I suspect that the left ovary is just below the cecum, measuring about 2.5 x 1.6 by 1.2 cm.  There is abnormal fluid in the right pericolic gutter along with a thick-walled appearance of the terminal ileum on coronal images.  Diffuse disk bulge with central annular tear at L5.  Urinary bladder unremarkable.  IMPRESSION: 1. Large necrotic mass along the posterior uterine margin. Statistically this is most likely to be a large predominantly necrotic intramural fibroid. [facility] CT [facility] no report of a uterine abnormality is identified. Resection should be considered given its size and irregular enhancing margins to be sure that it represents a benign process. This appears to be separate from the ovaries and accordingly is unlikely to represent an ovarian malignancy. 2. Free fluid in the right pericolic gutter and trauma with inflamed appearing terminal  ileum, query Crohn's disease.   Electronically Signed   By: WSherryl BartersM.D.   On: 02/16/2013 09:07   UKoreaTransvaginal Non-ob  02/15/2013   CLINICAL DATA:  Evaluate uterine mass.  Distended abdomen.  EXAM: TRANSABDOMINAL AND TRANSVAGINAL ULTRASOUND OF PELVIS  TECHNIQUE: Both transabdominal and transvaginal ultrasound examinations of the pelvis were performed. Transabdominal technique was performed for global imaging of the pelvis including uterus, ovaries, adnexal regions, and pelvic cul-de-sac. It was necessary to proceed with endovaginal exam following the transabdominal exam to visualize the uterus, endometrium and ovaries.  COMPARISON:  CT 02/14/2013  FINDINGS: Uterus  Measurements: A 15.4 x 9.7 x 9.2 cm. There is a complex markedly septated cystic mass noted along the posterior aspect of the uterus. This measures 11.0 x 7.3 x 8.6 cm. This appears to be within the posterior myometrium. Alternatively, this could be a large ovarian cystic mass displacing the uterus  Endometrium  Thickness: Displaced by the posterior mass, 4  mm in thickness. Limited visualization.  Right ovary  Measurements: Not visualized.  Left ovary  Measurements: Not visualized.  Other findings  Trace free fluid in the cul-de-sac  IMPRESSION: Large, severely complex cystic mass in the region of the posterior uterus. This measures up to 11.0 cm. Extensive internal septations. Differential considerations would include a large degenerating fibroid, while the degree of septation is unusual for this. Also, this could be a large ovarian cystic mass/ neoplasm which is displacing the uterus anteriorly. This should be further evaluated with an MRI of the pelvis without and with contrast.   Electronically Signed   By: Rolm Baptise M.D.   On: 02/15/2013 15:33   US Pelvis Complete  02/15/2013   CLINICAL DATA:  Evaluate uterine mass.  Distended abdomen.  EXAM: TRANSABDOMINAL AND TRANSVAGINAL ULTRASOUND OF PELVIS  TECHNIQUE: Both transabdominal  and transvaginal ultrasound examinations of the pelvis were performed. Transabdominal technique was performed for global imaging of the pelvis including uterus, ovaries, adnexal regions, and pelvic cul-de-sac. It was necessary to proceed with endovaginal exam following the transabdominal exam to visualize the uterus, endometrium and ovaries.  COMPARISON:  CT 02/14/2013  FINDINGS: Uterus  Measurements: A 15.4 x 9.7 x 9.2 cm. There is a complex markedly septated cystic mass noted along the posterior aspect of the uterus. This measures 11.0 x 7.3 x 8.6 cm. This appears to be within the posterior myometrium. Alternatively, this could be a large ovarian cystic mass displacing the uterus  Endometrium  Thickness: Displaced by the posterior mass, 4 mm in thickness. Limited visualization.  Right ovary  Measurements: Not visualized.  Left ovary  Measurements: Not visualized.  Other findings  Trace free fluid in the cul-de-sac  IMPRESSION: Large, severely complex cystic mass in the region of the posterior uterus. This measures up to 11.0 cm. Extensive internal septations. Differential considerations would include a large degenerating fibroid, while the degree of septation is unusual for this. Also, this could be a large ovarian cystic mass/ neoplasm which is displacing the uterus anteriorly. This should be further evaluated with an MRI of the pelvis without and with contrast.   Electronically Signed   By: Rolm Baptise M.D.   On: 02/15/2013 15:33   Ct Abdomen Pelvis W Contrast  02/14/2013   CLINICAL DATA:  Abdominal pain  EXAM: CT ABDOMEN AND PELVIS WITH CONTRAST  TECHNIQUE: Multidetector CT imaging of the abdomen and pelvis was performed using the standard protocol following bolus administration of intravenous contrast.  CONTRAST:  51m OMNIPAQUE IOHEXOL 300 MG/ML SOLN, 104mOMNIPAQUE IOHEXOL 300 MG/ML SOLN  COMPARISON:  None.  FINDINGS: The terminal ileum is markedly thickened over approximately 15 cm length. Mild  infiltration of the surrounding mesenteric fat. There is mild dilatation of the ileum proximal to the thickened loop. Remaining small bowel is not significantly dilated. FINDINGS most compatible with terminal ileitis, most likely Crohn's disease. No abscess is present. No free fluid.  Large irregular low-density mass involving the posterior fundus of the ureters. Endometrium is displaced anteriorly. This measures approximately 13 x 9 cm and is most likely a necrotic fibroid without calcification. This is touching the rectum and the bladder.  Lung bases are clear. Liver is normal. Cholecystectomy clips are present. Bile ducts nondilated. Pancreas spleen kidneys are normal.  IMPRESSION: Thickening of the terminal ileum, most consistent with Crohn's disease. No fistula or abscess is identified.  Large low-density irregular mass involving the uterus most likely a degenerating fibroid. Neoplasm not excluded.   Electronically Signed  By: Franchot Gallo M.D.   On: 02/14/2013 20:43    Microbiology: Recent Results (from the past 240 hour(s))  CLOSTRIDIUM DIFFICILE BY PCR     Status: None   Collection Time    02/15/13  2:52 AM      Result Value Range Status   C difficile by pcr NEGATIVE  NEGATIVE Final     Labs: Basic Metabolic Panel:  Recent Labs Lab 02/14/13 1629 02/15/13 0458 02/16/13 0607  NA 139 142 141  K 3.3* 4.1 3.8  CL 101 107 105  CO2 29 31 30   GLUCOSE 90 111* 95  BUN 10 6 6   CREATININE 0.90 0.92 0.87  CALCIUM 9.8 9.2 9.1   Liver Function Tests:  Recent Labs Lab 02/15/13 0458  AST 13  ALT 11  ALKPHOS 47  BILITOT 0.4  PROT 5.4*  ALBUMIN 2.5*   No results found for this basename: LIPASE, AMYLASE,  in the last 168 hours No results found for this basename: AMMONIA,  in the last 168 hours CBC:  Recent Labs Lab 02/14/13 1629 02/15/13 0458 02/16/13 0607  WBC 9.7 7.2 8.1  HGB 12.5 11.5* 11.7*  HCT 37.7 34.7* 35.2*  MCV 90.0 90.6 90.5  PLT 285 254 242   Cardiac  Enzymes: No results found for this basename: CKTOTAL, CKMB, CKMBINDEX, TROPONINI,  in the last 168 hours BNP: BNP (last 3 results) No results found for this basename: PROBNP,  in the last 8760 hours CBG: No results found for this basename: GLUCAP,  in the last 168 hours     Signed:  Saniyyah Elster  [facility] 02/16/2013, 2:19 PM

## 2013-02-16 NOTE — Progress Notes (Signed)
02/15/13 1326 Reviewed discharge instructions with patient. Given copy of instructions, medication list, prescriptions, f/u appointment information. Pt verbalized understanding of when medications due, when to call MD, states will call to set up follow-up appointments on Monday. No c/o pain or discomfort. Pt left floor in stable condition via ambulation, preferred to walk instead of wheelchair, accompanied by nurse. Donavan Foil, RN

## 2013-02-18 LAB — GI PATHOGEN PANEL BY PCR, STOOL
C difficile toxin A/B: NEGATIVE
Cryptosporidium by PCR: NEGATIVE
E coli 0157 by PCR: NEGATIVE
G lamblia by PCR: NEGATIVE
Rotavirus A by PCR: NEGATIVE
Shigella by PCR: NEGATIVE

## 2013-03-04 ENCOUNTER — Telehealth: Payer: Self-pay | Admitting: *Deleted

## 2013-03-04 NOTE — Telephone Encounter (Signed)
Pt called stating Dr. [name] told her to follow up in 2 weeks, I scheduled pt for nov. 19 at 2:45 because she is having surgery the 10th  Of December and pt states she want be able to be seen here with Dr. [name] until Jan.

## 2013-03-13 ENCOUNTER — Encounter (HOSPITAL_COMMUNITY): Payer: Self-pay | Admitting: Pharmacy Technician

## 2013-03-13 ENCOUNTER — Ambulatory Visit (INDEPENDENT_AMBULATORY_CARE_PROVIDER_SITE_OTHER): Payer: BC Managed Care – PPO | Admitting: Gastroenterology

## 2013-03-13 ENCOUNTER — Encounter: Payer: Self-pay | Admitting: Gastroenterology

## 2013-03-13 VITALS — BP 122/84 | HR 72 | Temp 98.5°F | Ht 64.0 in | Wt 143.6 lb

## 2013-03-13 DIAGNOSIS — K625 Hemorrhage of anus and rectum: Secondary | ICD-10-CM | POA: Insufficient documentation

## 2013-03-13 DIAGNOSIS — K5289 Other specified noninfective gastroenteritis and colitis: Secondary | ICD-10-CM

## 2013-03-13 DIAGNOSIS — K529 Noninfective gastroenteritis and colitis, unspecified: Secondary | ICD-10-CM

## 2013-03-13 NOTE — Progress Notes (Signed)
Cc PCP 

## 2013-03-13 NOTE — Patient Instructions (Signed)
COLONOSCOPY NOV 24 WITH POSSIBLE HEMORRHOID BANDING.  FOLLOW A HIGH FIBER DIET. SEE INFO BELOW.  DRINK WATER TO KEEP YOUR URINE LIGHT YELLOW.  FOLLOW UP IN MAR 2015.  Hemorrhoids Hemorrhoids are dilated (enlarged) veins around the rectum. Sometimes clots will form in the veins. This makes them swollen and painful. These are called thrombosed hemorrhoids. Causes of hemorrhoids include:  Constipation.   Straining to have a bowel movement.   HEAVY LIFTING HOME CARE INSTRUCTIONS  Eat a well balanced diet and drink 6 to 8 glasses of water every day to avoid constipation. You may also use a bulk laxative.   Avoid straining to have bowel movements.   Keep anal area dry and clean.   Do not use a donut shaped pillow or sit on the toilet for long periods. This increases blood pooling and pain.   Move your bowels when your body has the urge; this will require less straining and will decrease pain and pressure.   High-Fiber Diet A high-fiber diet changes your normal diet to include more whole grains, legumes, fruits, and vegetables. Changes in the diet involve replacing refined carbohydrates with unrefined foods. The calorie level of the diet is essentially unchanged. The Dietary Reference Intake (recommended amount) for adult males is 38 grams per day. For adult females, it is 25 grams per day. Pregnant and lactating women should consume 28 grams of fiber per day. Fiber is the intact part of a plant that is not broken down during digestion. Functional fiber is fiber that has been isolated from the plant to provide a beneficial effect in the body. PURPOSE  Increase stool bulk.   Ease and regulate bowel movements.   Lower cholesterol.  INDICATIONS THAT YOU NEED MORE FIBER  Constipation and hemorrhoids.   Uncomplicated diverticulosis (intestine condition) and irritable bowel syndrome.   Weight management.   As a protective measure against hardening of the arteries (atherosclerosis),  diabetes, and cancer.   GUIDELINES FOR INCREASING FIBER IN THE DIET  Start adding fiber to the diet slowly. A gradual increase of about 5 more grams (2 slices of whole-wheat bread, 2 servings of most fruits or vegetables, or 1 bowl of high-fiber cereal) per day is best. Too rapid an increase in fiber may result in constipation, flatulence, and bloating.   Drink enough water and fluids to keep your urine clear or pale yellow. Water, juice, or caffeine-free drinks are recommended. Not drinking enough fluid may cause constipation.   Eat a variety of high-fiber foods rather than one type of fiber.   Try to increase your intake of fiber through using high-fiber foods rather than fiber pills or supplements that contain small amounts of fiber.   The goal is to change the types of food eaten. Do not supplement your present diet with high-fiber foods, but replace foods in your present diet.  INCLUDE A VARIETY OF FIBER SOURCES  Replace refined and processed grains with whole grains, canned fruits with fresh fruits, and incorporate other fiber sources. White rice, white breads, and most bakery goods contain little or no fiber.   Brown whole-grain rice, buckwheat oats, and many fruits and vegetables are all good sources of fiber. These include: broccoli, Brussels sprouts, cabbage, cauliflower, beets, sweet potatoes, white potatoes (skin on), carrots, tomatoes, eggplant, squash, berries, fresh fruits, and dried fruits.   Cereals appear to be the richest source of fiber. Cereal fiber is found in whole grains and bran. Bran is the fiber-rich outer coat of cereal grain, which  is largely removed in refining. In whole-grain cereals, the bran remains. In breakfast cereals, the largest amount of fiber is found in those with "bran" in their names. The fiber content is sometimes indicated on the label.   You may need to include additional fruits and vegetables each day.   In baking, for 1 cup white flour, you may  use the following substitutions:   1 cup whole-wheat flour minus 2 tablespoons.   1/2 cup white flour plus 1/2 cup whole-wheat flour.

## 2013-03-13 NOTE — Addendum Note (Signed)
Addended by: Idamae Schuller on: 03/13/2013 03:56 PM   Modules accepted: Orders

## 2013-03-13 NOTE — Assessment & Plan Note (Signed)
SX RESOLVED.  TCS TO EVALUATE FOR COMPLETE RESOLUTION

## 2013-03-13 NOTE — Assessment & Plan Note (Signed)
SX INTERMITTENT AND ASSOCIATED WITH HEMORRHOIDS STAYING OUT, LESS LIKELY COLON POLYP/CA.  TCS NOV 24 WITH POSSIBLE HEMORRHOID BANDING. PHENERGAN 12.5 MG IV IN PREOP & WILL USE FENTANYL. OPV IN [facility] 2015

## 2013-03-13 NOTE — Progress Notes (Signed)
  Subjective:    Patient ID: Renee Atkinson, female    DOB: March 24, 1966, 47 y.o.   MRN: [id number]  Deloria Lair, MD OB/GYN: [facility]  HPI No abd pain. BmS: better-solid. HAS HEMORRHOIDS THAT WON'T GO BACK UP IN HER ANUS. SHE MAY WIPES SO MUCH AND THEY BLEED. [place]. PT DENIES FEVER, CHILLS, BRBPR, nausea, vomiting, melena, diarrhea, constipation, problems swallowing, problems with sedation, OR heartburn or indigestion.  Past Medical History  Diagnosis Date  . Depression   . GERD (gastroesophageal reflux disease)    Past Surgical History  Procedure Laterality Date  . Cholecystectomy      Allergies  Allergen Reactions  . Dilaudid [Hydromorphone Hcl] Other (See Comments)    Severe headaches  . Morphine And Related Nausea And Vomiting   Current Outpatient Prescriptions  Medication Sig Dispense Refill  . citalopram (CELEXA) 40 MG tablet Take 40 mg by mouth at bedtime.      . clonazePAM (KLONOPIN) 0.5 MG tablet Take 0.5 mg by mouth at bedtime.      Marland Kitchen esomeprazole (NEXIUM) 20 MG capsule Take 20 mg by mouth at bedtime.      . fluticasone (FLONASE) 50 MCG/ACT nasal spray Place 2 sprays into the nose daily.        Family History  Problem Relation Age of Onset  . Inflammatory bowel disease Neg Hx   . Colon cancer Neg Hx   . Prostate cancer Father   . Diverticulitis Mother     complicated, colostomy bag temporarily        Review of Systems     Objective:   Physical Exam  Vitals reviewed. Constitutional: She is oriented to person, place, and time. She appears well-nourished. She appears distressed.  HENT:  Head: Normocephalic and atraumatic.  Mouth/Throat: Oropharynx is clear and moist. No oropharyngeal exudate.  Eyes: Pupils are equal, round, and reactive to light. No scleral icterus.  Neck: Normal range of motion. Neck supple.  Cardiovascular: Normal rate, regular rhythm and normal heart sounds.   Pulmonary/Chest: Effort  normal and breath sounds normal. No respiratory distress.  Abdominal: Soft. Bowel sounds are normal. She exhibits no distension and no mass. There is no tenderness.  Musculoskeletal: Normal range of motion. She exhibits no edema.  Lymphadenopathy:    She has no cervical adenopathy.  Neurological: She is alert and oriented to person, place, and time.  NO FOCAL DEFICITS   Psychiatric:  SLIGHTLY ANXIOUS MOOD, NL AFFECT           Assessment & Plan:

## 2013-03-14 NOTE — Progress Notes (Signed)
Reminder in epic °

## 2013-03-15 ENCOUNTER — Telehealth: Payer: Self-pay | Admitting: Gastroenterology

## 2013-03-15 ENCOUNTER — Other Ambulatory Visit: Payer: Self-pay | Admitting: Gastroenterology

## 2013-03-15 MED ORDER — SOD PICOSULFATE-MAG OX-CIT ACD 10-3.5-12 MG-GM-GM PO PACK: 1.0000 | PACK | ORAL | Status: DC

## 2013-03-15 NOTE — Telephone Encounter (Signed)
Done

## 2013-03-15 NOTE — Telephone Encounter (Signed)
Pt is scheduled for Monday with SF. Her prep rx hasn't been sent to the pharmacy yet. Please fax it to [phone] [facility]

## 2013-03-18 ENCOUNTER — Encounter (HOSPITAL_COMMUNITY)
Admission: RE | Disposition: A | Discharge status: Home / Self Care | Payer: Self-pay | Source: Ambulatory Visit | Attending: Gastroenterology

## 2013-03-18 ENCOUNTER — Encounter (HOSPITAL_COMMUNITY): Payer: Self-pay

## 2013-03-18 ENCOUNTER — Ambulatory Visit (HOSPITAL_COMMUNITY)
Admission: RE | Admit: 2013-03-18 | Discharge: 2013-03-18 | Disposition: A | Discharge status: Home / Self Care | Payer: BC Managed Care – PPO | Source: Ambulatory Visit | Attending: Gastroenterology | Admitting: Gastroenterology

## 2013-03-18 ENCOUNTER — Telehealth: Payer: Self-pay

## 2013-03-18 DIAGNOSIS — R935 Abnormal findings on diagnostic imaging of other abdominal regions, including retroperitoneum: Secondary | ICD-10-CM | POA: Insufficient documentation

## 2013-03-18 DIAGNOSIS — K5010 Crohn's disease of large intestine without complications: Secondary | ICD-10-CM | POA: Insufficient documentation

## 2013-03-18 DIAGNOSIS — K633 Ulcer of intestine: Secondary | ICD-10-CM

## 2013-03-18 DIAGNOSIS — K648 Other hemorrhoids: Secondary | ICD-10-CM

## 2013-03-18 DIAGNOSIS — R599 Enlarged lymph nodes, unspecified: Secondary | ICD-10-CM | POA: Insufficient documentation

## 2013-03-18 DIAGNOSIS — K5289 Other specified noninfective gastroenteritis and colitis: Secondary | ICD-10-CM

## 2013-03-18 DIAGNOSIS — D259 Leiomyoma of uterus, unspecified: Secondary | ICD-10-CM | POA: Insufficient documentation

## 2013-03-18 DIAGNOSIS — K625 Hemorrhage of anus and rectum: Secondary | ICD-10-CM

## 2013-03-18 DIAGNOSIS — K5730 Diverticulosis of large intestine without perforation or abscess without bleeding: Secondary | ICD-10-CM | POA: Insufficient documentation

## 2013-03-18 DIAGNOSIS — K56609 Unspecified intestinal obstruction, unspecified as to partial versus complete obstruction: Secondary | ICD-10-CM | POA: Insufficient documentation

## 2013-03-18 HISTORY — PX: COLONOSCOPY: SHX5424

## 2013-03-18 SURGERY — COLONOSCOPY
Anesthesia: Moderate Sedation

## 2013-03-18 MED ORDER — FENTANYL CITRATE 0.05 MG/ML IJ SOLN
INTRAMUSCULAR | Status: DC | PRN
  Administered 2013-03-18 (×2): 25 ug via INTRAVENOUS
  Administered 2013-03-18: 50 ug via INTRAVENOUS
  Administered 2013-03-18 (×2): 25 ug via INTRAVENOUS

## 2013-03-18 MED ORDER — SODIUM CHLORIDE 0.9 % IJ SOLN
INTRAMUSCULAR | Status: AC
  Filled 2013-03-18: qty 10

## 2013-03-18 MED ORDER — PROMETHAZINE HCL 25 MG/ML IJ SOLN
INTRAMUSCULAR | Status: AC
  Filled 2013-03-18: qty 1

## 2013-03-18 MED ORDER — PROMETHAZINE HCL 25 MG/ML IJ SOLN
12.5000 mg | Freq: Once | INTRAMUSCULAR | Status: AC
  Administered 2013-03-18: 12.5 mg via INTRAVENOUS

## 2013-03-18 MED ORDER — MIDAZOLAM HCL 5 MG/5ML IJ SOLN
INTRAMUSCULAR | Status: DC | PRN
  Administered 2013-03-18 (×5): 2 mg via INTRAVENOUS

## 2013-03-18 MED ORDER — STERILE WATER FOR IRRIGATION IR SOLN
Status: DC | PRN
  Administered 2013-03-18: 13:00:00

## 2013-03-18 MED ORDER — MIDAZOLAM HCL 5 MG/5ML IJ SOLN
INTRAMUSCULAR | Status: AC
  Filled 2013-03-18: qty 10

## 2013-03-18 MED ORDER — IOHEXOL 300 MG/ML  SOLN
100.0000 mL | Freq: Once | INTRAMUSCULAR | Status: AC | PRN
  Administered 2013-03-18: 100 mL via INTRAVENOUS

## 2013-03-18 MED ORDER — PROMETHAZINE HCL 25 MG/ML IJ SOLN
INTRAMUSCULAR | Status: DC | PRN
  Administered 2013-03-18: 12.5 mg via INTRAVENOUS

## 2013-03-18 MED ORDER — MEPERIDINE HCL 100 MG/ML IJ SOLN
INTRAMUSCULAR | Status: AC
  Filled 2013-03-18: qty 2

## 2013-03-18 MED ORDER — FENTANYL CITRATE 0.05 MG/ML IJ SOLN
INTRAMUSCULAR | Status: AC
  Filled 2013-03-18: qty 4

## 2013-03-18 MED ORDER — SODIUM CHLORIDE 0.9 % IV SOLN
INTRAVENOUS | Status: DC
  Administered 2013-03-18: 13:00:00 via INTRAVENOUS

## 2013-03-18 NOTE — H&P (View-Only) (Signed)
  Subjective:    Patient ID: Renee Atkinson, female    DOB: 21-Mar-1966, 47 y.o.   MRN: [id number]  Renee Lair, MD OB/GYN: [facility]  HPI No abd pain. BmS: better-solid. HAS HEMORRHOIDS THAT WON'T GO BACK UP IN HER ANUS. SHE MAY WIPES SO MUCH AND THEY BLEED. [place]. PT DENIES FEVER, CHILLS, BRBPR, nausea, vomiting, melena, diarrhea, constipation, problems swallowing, problems with sedation, OR heartburn or indigestion.  Past Medical History  Diagnosis Date  . Depression   . GERD (gastroesophageal reflux disease)    Past Surgical History  Procedure Laterality Date  . Cholecystectomy      Allergies  Allergen Reactions  . Dilaudid [Hydromorphone Hcl] Other (See Comments)    Severe headaches  . Morphine And Related Nausea And Vomiting   Current Outpatient Prescriptions  Medication Sig Dispense Refill  . citalopram (CELEXA) 40 MG tablet Take 40 mg by mouth at bedtime.      . clonazePAM (KLONOPIN) 0.5 MG tablet Take 0.5 mg by mouth at bedtime.      Marland Kitchen esomeprazole (NEXIUM) 20 MG capsule Take 20 mg by mouth at bedtime.      . fluticasone (FLONASE) 50 MCG/ACT nasal spray Place 2 sprays into the nose daily.        Family History  Problem Relation Age of Onset  . Inflammatory bowel disease Neg Hx   . Colon cancer Neg Hx   . Prostate cancer Father   . Diverticulitis Mother     complicated, colostomy bag temporarily        Review of Systems     Objective:   Physical Exam  Vitals reviewed. Constitutional: She is oriented to person, place, and time. She appears well-nourished. She appears distressed.  HENT:  Head: Normocephalic and atraumatic.  Mouth/Throat: Oropharynx is clear and moist. No oropharyngeal exudate.  Eyes: Pupils are equal, round, and reactive to light. No scleral icterus.  Neck: Normal range of motion. Neck supple.  Cardiovascular: Normal rate, regular rhythm and normal heart sounds.   Pulmonary/Chest: Effort  normal and breath sounds normal. No respiratory distress.  Abdominal: Soft. Bowel sounds are normal. She exhibits no distension and no mass. There is no tenderness.  Musculoskeletal: Normal range of motion. She exhibits no edema.  Lymphadenopathy:    She has no cervical adenopathy.  Neurological: She is alert and oriented to person, place, and time.  NO FOCAL DEFICITS   Psychiatric:  SLIGHTLY ANXIOUS MOOD, NL AFFECT           Assessment & Plan:

## 2013-03-18 NOTE — Op Note (Addendum)
[facility] [address] [place], [id number]   COLONOSCOPY PROCEDURE REPORT  PATIENT: Renee Atkinson, Renee Atkinson  MR#: [id number] BIRTHDATE: [birth year] , 31  yrs. old GENDER: Female ENDOSCOPIST: Barney Drain, MD REFERRED GG:YIRSW Tapper, M.D.  Alden Hipp, M.D. PROCEDURE DATE:  03/18/2013 PROCEDURE:   Colonoscopy with biopsy INDICATIONS:Rectal Bleeding and ileitis on CT OCT 23.  PT HAS A HISTORY OF USING 3-4 GOODYS DAILY FOR AN EXTENDED TIME. LAST GOODYS 2 MOS AGO. PT C/O HA TODAY. MEDICATIONS: Fentanyl 150 mcg IV, Versed 10 mg IV, and PREOP: Promethazine (Phenergan) 12.5 mg IV/12.5 MG IV  DESCRIPTION OF PROCEDURE:    Physical exam was performed.  Informed consent was obtained from the patient after explaining the benefits, risks, and alternatives to procedure.  The patient was connected to monitor and placed in left lateral position. Continuous oxygen was provided by nasal cannula and IV medicine administered through an indwelling cannula.  After administration of sedation and rectal exam, the patients rectum was intubated and the EC-[id number] ([id number])  colonoscope was advanced under direct visualization to the cecum.  The scope was removed slowly by carefully examining the color, texture, anatomy, and integrity mucosa on the way out.  The patient was recovered in endoscopy and discharged home in satisfactory condition.    COLON FINDINGS: INFLAMED, EDEMATOUS, ULCERATED STENOTIC ILEOCECAL VALVE.  STRICTURE BALLOON DILATED FROM 10 MM T0 12 MM.  UNABLE TO INTUBATE DISTAL ILEUM AFTER ATTEMPTING FOR 30 MINS.  PT REPOSITIONED AND PRESSURE APPLIED.  PT HAD REDUNDANT TRANSVERSE COLON, A OCCASIONAL small ulcers found at the IC valve, in the ascending and sigmoid colon.  Multiple biopsies were performed using cold forceps. Moderate sized internal hemorrhoids were found. OCCASIONAL DIVERTICULA IN THE SIGMOID COLON. NO POLYPS. PREP QUALITY: good. CECAL W/D TIME: 42 minutes      COMPLICATIONS: PT AGITATED DURING TCS IN SPITE OF ADEQUATE SEDATION. PT HAD RECALL.  ENDOSCOPIC IMPRESSION: 1.  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 NSAIDs 2.  Ulcer at the ileocecal valve, & MILD COLITIS in the ascending colon, and sigmoid colon 3.  Moderate sized internal hemorrhoids  RECOMMENDATIONS: REPEAT CT SCAN TODAY. STOP SMOKING. FOLLOW A LOW RESIDUE DIET.   AVOID ASA/NSAIDS. TYLENOL OR TYLENOL/NARCOTIC PRN FOR PAIN BIOPSY WILL BE BACK IN 7 DAYS. FOLLOW UP IN MAR 2015.  CONSIDER [facility] BANDING OR FLEX SIG WITH PROPOFOL TO ADDRESS HEMORRHOIDS AFTER WORK UP FOR ILEITIS IS COMPLETE.  [place]  KAPLAN TO DISCUSS.  HE IS UNAVAILABLE UNTIL AFTER NOV 30.       _______________________________ Lorrin MaisBarney Drain, MD 03/18/2013 3:42 PM Revised: 03/18/2013 3:42 PM    PATIENT NAME:  Renee Atkinson, Renee Atkinson MR#: [id number]

## 2013-03-18 NOTE — Progress Notes (Signed)
Pt. Ambulated to radiology for CT scan.  IV saline locked in R AC.  Pt. 's belonging & step mom with pt.  Pt. undertood all DC instructions & verbalized understanding.

## 2013-03-18 NOTE — Telephone Encounter (Signed)
REVIEWED.  

## 2013-03-18 NOTE — Telephone Encounter (Signed)
T/C from Ander Purpura at [facility], just wanted to let Dr. [name] know they have tried to reach her here and at the hospital. Dr. [name] has said she needs to speak to the doctor before pt's hysterectomy. Dr. Deatra Ina will not be back in the office until 03/25/2013. If she needs to speak to on call doctor, that will be fine. Phone number is [phone].

## 2013-03-18 NOTE — Interval H&P Note (Signed)
History and Physical Interval Note:  03/18/2013 1:12 PM  Renee Atkinson  has presented today for surgery, with the diagnosis of RECTAL BLEEDING  The various methods of treatment have been discussed with the patient and family. After consideration of risks, benefits and other options for treatment, the patient has consented to  Procedure(s) with comments: COLONOSCOPY (N/A) - 2:15PM HEMORRHOID BANDING (N/A) as a surgical intervention .  The patient's history has been reviewed, patient examined, no change in status, stable for surgery.  I have reviewed the patient's chart and labs.  Questions were answered to the patient's satisfaction.     [employer]

## 2013-03-22 NOTE — Telephone Encounter (Signed)
Called patient TO DISCUSS RESULTS. EXPLAINED CT AND PATH RESULTS. PT NOT HAVING NVABD PAIN OR DIARRHEA. LESION IN SMALL BOWEL IS CHRONIC AND MOST LIKELY CROHN'S. PT HAVING HYSTERECTOMY DEC 9. WILL DISCUSS WITH DR. KAPLAN. PLAN FOR REPEAT TCS WITH ULTRASLIM SCOPE/OVERTUBE/ PROPOFOL MID-FEB OR LATE MAR FOR BX ILEUM FOR DEFINITIVE DIAGNOSIS. PT CURRENTLY ASYMPTOMATIC WILL HOLD OFF ON THIOPURINES/BIOLOGICS/5-ASAs UNTIL PT RECOVERED FROM HSY.  WILL CALL DR. KAPLAN ON MON TO DISCUSS AND CALL PT BACK AFTER MON. OPV APR 2015 E 30  ILEITIS.

## 2013-03-25 ENCOUNTER — Encounter (HOSPITAL_COMMUNITY): Payer: Self-pay | Admitting: Gastroenterology

## 2013-03-25 NOTE — Telephone Encounter (Signed)
Reminder in epic for appointment

## 2013-03-27 ENCOUNTER — Encounter (HOSPITAL_COMMUNITY): Payer: Self-pay

## 2013-03-27 ENCOUNTER — Encounter (HOSPITAL_COMMUNITY)
Admission: RE | Admit: 2013-03-27 | Discharge: 2013-03-27 | Disposition: A | Discharge status: Home / Self Care | Payer: BC Managed Care – PPO | Source: Ambulatory Visit | Attending: Obstetrics & Gynecology | Admitting: Obstetrics & Gynecology

## 2013-03-27 DIAGNOSIS — Z01812 Encounter for preprocedural laboratory examination: Secondary | ICD-10-CM | POA: Insufficient documentation

## 2013-03-27 DIAGNOSIS — Z01818 Encounter for other preprocedural examination: Secondary | ICD-10-CM | POA: Insufficient documentation

## 2013-03-27 HISTORY — DX: Insomnia, unspecified: G47.00

## 2013-03-27 LAB — CBC
MCH: 29.9 pg (ref 26.0–34.0)
MCV: 89.4 fL (ref 78.0–100.0)
Platelets: 264 10*3/uL (ref 150–400)
RDW: 14.3 % (ref 11.5–15.5)

## 2013-03-27 NOTE — Patient Instructions (Addendum)
   Your procedure is scheduled on: Tuesday, Dec 9th  Enter through the [employer] [facility] at: [place] up the phone at the desk and dial [phone] and inform us of your arrival.  Please call this number if you have any problems the morning of surgery: [phone]  Remember: Do not eat food after midnight: Monday Do not drink clear liquids after: 8 am Tuesday. day of surgery Take these medicines the morning of surgery with a SIP OF WATER:  None  Do not wear jewelry, make-up, or FINGER nail polish No metal in your hair or on your body. Do not wear lotions, powders, perfumes. You may wear deodorant.  Please use your CHG wash as directed prior to surgery.  Do not shave anywhere for at least 12 hours prior to first CHG shower.  Do not bring valuables to the hospital. Contacts, dentures or bridgework may not be worn into surgery.  Leave suitcase in the car. After Surgery it may be brought to your room. For patients being admitted to the hospital, checkout time is 11:00am the day of discharge.  Home with husband Merry Proud cell [phone]

## 2013-03-30 ENCOUNTER — Telehealth: Payer: Self-pay | Admitting: Internal Medicine

## 2013-03-30 ENCOUNTER — Encounter (HOSPITAL_COMMUNITY): Payer: Self-pay | Admitting: Emergency Medicine

## 2013-03-30 ENCOUNTER — Emergency Department (HOSPITAL_COMMUNITY)
Admission: EM | Admit: 2013-03-30 | Discharge: 2013-03-31 | Disposition: A | Discharge status: Home / Self Care | Payer: BC Managed Care – PPO | Attending: Emergency Medicine | Admitting: Emergency Medicine

## 2013-03-30 ENCOUNTER — Emergency Department (HOSPITAL_COMMUNITY): Payer: BC Managed Care – PPO

## 2013-03-30 DIAGNOSIS — F17200 Nicotine dependence, unspecified, uncomplicated: Secondary | ICD-10-CM | POA: Insufficient documentation

## 2013-03-30 DIAGNOSIS — K219 Gastro-esophageal reflux disease without esophagitis: Secondary | ICD-10-CM | POA: Insufficient documentation

## 2013-03-30 DIAGNOSIS — Z79899 Other long term (current) drug therapy: Secondary | ICD-10-CM | POA: Insufficient documentation

## 2013-03-30 DIAGNOSIS — F3289 Other specified depressive episodes: Secondary | ICD-10-CM | POA: Insufficient documentation

## 2013-03-30 DIAGNOSIS — E876 Hypokalemia: Secondary | ICD-10-CM | POA: Insufficient documentation

## 2013-03-30 DIAGNOSIS — R1031 Right lower quadrant pain: Secondary | ICD-10-CM

## 2013-03-30 DIAGNOSIS — F329 Major depressive disorder, single episode, unspecified: Secondary | ICD-10-CM | POA: Insufficient documentation

## 2013-03-30 LAB — COMPREHENSIVE METABOLIC PANEL
AST: 18 U/L (ref 0–37)
Albumin: 3.1 g/dL — ABNORMAL LOW (ref 3.5–5.2)
Alkaline Phosphatase: 57 U/L (ref 39–117)
BUN: 10 mg/dL (ref 6–23)
Chloride: 102 mEq/L (ref 96–112)
GFR calc Af Amer: >90 mL/min (ref >90)
Glucose, Bld: 88 mg/dL (ref 70–99)
Potassium: 3.2 mEq/L — ABNORMAL LOW (ref 3.5–5.1)
Sodium: 139 mEq/L (ref 135–145)
Total Bilirubin: 0.4 mg/dL (ref 0.3–1.2)
Total Protein: 6.5 g/dL (ref 6.0–8.3)

## 2013-03-30 LAB — CBC WITH DIFFERENTIAL/PLATELET
Basophils Absolute: 0 10*3/uL (ref 0.0–0.1)
Basophils Relative: 0 % (ref 0–1)
Eosinophils Absolute: 0.2 10*3/uL (ref 0.0–0.7)
MCH: 30.1 pg (ref 26.0–34.0)
MCHC: 33.5 g/dL (ref 30.0–36.0)
Monocytes Relative: 8 % (ref 3–12)
Neutro Abs: 5.9 10*3/uL (ref 1.7–7.7)
Neutrophils Relative %: 64 % (ref 43–77)
Platelets: 244 10*3/uL (ref 150–400)
RDW: 14.3 % (ref 11.5–15.5)
WBC: 9.2 10*3/uL (ref 4.0–10.5)

## 2013-03-30 LAB — URINE MICROSCOPIC-ADD ON

## 2013-03-30 LAB — URINALYSIS, ROUTINE W REFLEX MICROSCOPIC
Bilirubin Urine: NEGATIVE
Ketones, ur: NEGATIVE mg/dL
Nitrite: NEGATIVE
Protein, ur: NEGATIVE mg/dL
Urobilinogen, UA: 0.2 mg/dL (ref 0.0–1.0)

## 2013-03-30 MED ORDER — SODIUM CHLORIDE 0.9 % IV SOLN
1000.0000 mL | Freq: Once | INTRAVENOUS | Status: AC
  Administered 2013-03-30: 1000 mL via INTRAVENOUS

## 2013-03-30 MED ORDER — IOHEXOL 300 MG/ML  SOLN: 50.0000 mL | Freq: Once | INTRAMUSCULAR | Status: AC | PRN

## 2013-03-30 MED ORDER — IOHEXOL 300 MG/ML  SOLN
100.0000 mL | Freq: Once | INTRAMUSCULAR | Status: AC | PRN
  Administered 2013-03-30: 100 mL via INTRAVENOUS

## 2013-03-30 MED ORDER — SODIUM CHLORIDE 0.9 % IV SOLN
1000.0000 mL | INTRAVENOUS | Status: DC
  Administered 2013-03-30: 500 mL via INTRAVENOUS

## 2013-03-30 MED ORDER — ONDANSETRON HCL 4 MG/2ML IJ SOLN
4.0000 mg | Freq: Once | INTRAMUSCULAR | Status: DC
  Filled 2013-03-30: qty 2

## 2013-03-30 MED ORDER — POTASSIUM CHLORIDE CRYS ER 20 MEQ PO TBCR
40.0000 meq | EXTENDED_RELEASE_TABLET | Freq: Once | ORAL | Status: AC
  Administered 2013-03-30: 40 meq via ORAL
  Filled 2013-03-30: qty 2

## 2013-03-30 MED ORDER — ONDANSETRON HCL 4 MG/2ML IJ SOLN
4.0000 mg | Freq: Once | INTRAMUSCULAR | Status: AC
  Administered 2013-03-30: 4 mg via INTRAVENOUS

## 2013-03-30 MED ORDER — FENTANYL CITRATE 0.05 MG/ML IJ SOLN
50.0000 ug | Freq: Once | INTRAMUSCULAR | Status: AC
  Administered 2013-03-30: 50 ug via INTRAVENOUS
  Filled 2013-03-30: qty 2

## 2013-03-30 NOTE — ED Notes (Signed)
Constipated with abd pain for 5 days, took laxative with good results today. Now pain increased lower right side

## 2013-03-30 NOTE — ED Notes (Signed)
Pt c/o pain to RLQ of abdomen. States it feels similar to pain she has experienced before which they believe to be associated with Chron's disease. States she has been constipated x 5 days, took a laxative today with success.

## 2013-03-30 NOTE — ED Notes (Signed)
Pt reports episodes of diarrhea beginning in radiology.

## 2013-03-30 NOTE — Telephone Encounter (Signed)
Pt  Called; recurrent rlq abd pain; recent constipation - self treated with 4 doses of miralax over p 24 hours. Now w non-bloody diarrhea.  No N, V or fever.  Reports pain 7/10.  I revieiwed recent work-up; CT, etc.  Currently on no meds for possible Crohns; Plan for hysterectomy next week.  Appendix remains in place. Given degree/location of pain and atypical sx for crohns recently, I advised pt to got to the ED - evaluate for appendicitis, etc.She seemed to understand.

## 2013-03-30 NOTE — ED Provider Notes (Signed)
CSN: [id number]     Arrival date & time 03/30/13  1930 History   First MD Initiated Contact with Patient 03/30/13 1939     Chief Complaint  Patient presents with  . Abdominal Pain   (Consider location/radiation/quality/duration/timing/severity/associated sxs/prior Treatment) HPI Patient reports she was admitted in October and diagnosed with inflammation of her colon and possible Crohn's disease. She was treated with Cipro and Flagyl. She reports she had a colonoscopy done on November 24 by Dr. fields however she was unable to get in the area because of the inflammation. She reports she started having constipation four days ago and right lower quadrant pain which she had before in October. She started taking MiraLAX and finally today she had a hard stool followed by about 5 stools. She states the pressure was taken off the right lower quadrant however she still has discomfort there. She states only thing that makes the pain hurt more is straining. She denies any pain with deep breathing or movement. Her pain was improved after having a BM but did not go away. She states the pain has been constant since Tuesday, 4 days ago when it started. The pain is described as sharp. She did have nausea today without vomiting. She denies any fever. She has had decreased appetite.  She states her mother had a perforation of the colon from inflammation and had a temporary colostomy.  PCP Dr Scotty Court GI Dr [name]  Past Medical History  Diagnosis Date  . Depression   . GERD (gastroesophageal reflux disease)   . SVD (spontaneous vaginal delivery)     x 2  . Insomnia    Past Surgical History  Procedure Laterality Date  . Cholecystectomy    . Colonoscopy N/A 03/18/2013    Procedure: COLONOSCOPY;  Surgeon: Danie Binder, MD;  [facility]: [facility];  Service: Endoscopy;  Laterality: N/A;  2:15PM  . Tubal ligation     Family History  Problem Relation Age of Onset  . Inflammatory bowel disease Neg Hx   .  Colon cancer Neg Hx   . Prostate cancer Father   . Diverticulitis Mother     complicated, colostomy bag temporarily   History  Substance Use Topics  . Smoking status: Current Every Day Smoker -- 0.50 packs/day for 15 years    Types: Cigarettes  . Smokeless tobacco: Never Used  . Alcohol Use: No   employed  OB History   Grav Para Term Preterm Abortions TAB SAB Ect Mult Living                 Review of Systems  All other systems reviewed and are negative.    Allergies  Dilaudid and Morphine and related  Home Medications   Current Outpatient Rx  Name  Route  Sig  Dispense  Refill  . citalopram (CELEXA) 40 MG tablet   Oral   Take 40 mg by mouth at bedtime.         . clonazePAM (KLONOPIN) 0.5 MG tablet   Oral   Take 0.5 mg by mouth at bedtime.         Marland Kitchen esomeprazole (NEXIUM) 20 MG capsule   Oral   Take 20 mg by mouth at bedtime.          BP 144/82  Pulse 102  Temp(Src) 98.5 F (36.9 C) (Oral)  Resp 18  Ht 5' 4"  (1.626 m)  Wt 142 lb (64.411 kg)  BMI 24.36 kg/m2  SpO2 100%  Vital signs  normal except tachycardia  Physical Exam  Nursing note and vitals reviewed. Constitutional: She is oriented to person, place, and time. She appears well-developed and well-nourished.  Non-toxic appearance. She does not appear ill. No distress.  HENT:  Head: Normocephalic and atraumatic.  Right Ear: External ear normal.  Left Ear: External ear normal.  Nose: Nose normal. No mucosal edema or rhinorrhea.  Mouth/Throat: Oropharynx is clear and moist and mucous membranes are normal. No dental abscesses or uvula swelling.  Tongue dry  Eyes: Conjunctivae and EOM are normal. Pupils are equal, round, and reactive to light.  Neck: Normal range of motion and full passive range of motion without pain. Neck supple.  Cardiovascular: Normal rate, regular rhythm and normal heart sounds.  Exam reveals no gallop and no friction rub.   No murmur heard. Pulmonary/Chest: Effort normal  and breath sounds normal. No respiratory distress. She has no wheezes. She has no rhonchi. She has no rales. She exhibits no tenderness and no crepitus.  Abdominal: Soft. Normal appearance and bowel sounds are normal. She exhibits no distension. There is tenderness. There is no rebound and no guarding.    Musculoskeletal: Normal range of motion. She exhibits no edema and no tenderness.  Moves all extremities well.   Neurological: She is alert and oriented to person, place, and time. She has normal strength. No cranial nerve deficit.  Skin: Skin is warm, dry and intact. No rash noted. No erythema. No pallor.  Psychiatric: She has a normal mood and affect. Her speech is normal and behavior is normal. Her mood appears not anxious.    ED Course  Procedures (including critical care time)  Medications  0.9 %  sodium chloride infusion (1,000 mLs Intravenous New Bag/Given 03/30/13 2056)    Followed by  0.9 %  sodium chloride infusion (not administered)  iohexol (OMNIPAQUE) 300 MG/ML solution 50 mL (not administered)  iohexol (OMNIPAQUE) 300 MG/ML solution 100 mL (not administered)  potassium chloride SA (K-DUR,KLOR-CON) CR tablet 40 mEq (not administered)  fentaNYL (SUBLIMAZE) injection 50 mcg (50 mcg Intravenous Given 03/30/13 2056)  ondansetron (ZOFRAN) injection 4 mg (4 mg Intravenous Given 03/30/13 2058)    Patient states she can't take morphine or dilaudid for pain.  PT left at change of shift with Dr Sabra Heck to get her CT results.     Labs Review Results for orders placed during the hospital encounter of 03/30/13  URINALYSIS, ROUTINE W REFLEX MICROSCOPIC      Result Value Range   Color, Urine YELLOW  YELLOW   APPearance CLEAR  CLEAR   Specific Gravity, Urine 1.025  1.005 - 1.030   pH 6.0  5.0 - 8.0   Glucose, UA NEGATIVE  NEGATIVE mg/dL   Hgb urine dipstick SMALL (*) NEGATIVE   Bilirubin Urine NEGATIVE  NEGATIVE   Ketones, ur NEGATIVE  NEGATIVE mg/dL   Protein, ur NEGATIVE   NEGATIVE mg/dL   Urobilinogen, UA 0.2  0.0 - 1.0 mg/dL   Nitrite NEGATIVE  NEGATIVE   Leukocytes, UA NEGATIVE  NEGATIVE  CBC WITH DIFFERENTIAL      Result Value Range   WBC 9.2  4.0 - 10.5 K/uL   RBC 4.08  3.87 - 5.11 MIL/uL   Hemoglobin 12.3  12.0 - 15.0 g/dL   HCT 36.7  36.0 - 46.0 %   MCV 90.0  78.0 - 100.0 fL   MCH 30.1  26.0 - 34.0 pg   MCHC 33.5  30.0 - 36.0 g/dL   RDW 14.3  11.5 - 15.5 %   Platelets 244  150 - 400 K/uL   Neutrophils Relative % 64  43 - 77 %   Neutro Abs 5.9  1.7 - 7.7 K/uL   Lymphocytes Relative 26  12 - 46 %   Lymphs Abs 2.4  0.7 - 4.0 K/uL   Monocytes Relative 8  3 - 12 %   Monocytes Absolute 0.8  0.1 - 1.0 K/uL   Eosinophils Relative 2  0 - 5 %   Eosinophils Absolute 0.2  0.0 - 0.7 K/uL   Basophils Relative 0  0 - 1 %   Basophils Absolute 0.0  0.0 - 0.1 K/uL  COMPREHENSIVE METABOLIC PANEL      Result Value Range   Sodium 139  135 - 145 mEq/L   Potassium 3.2 (*) 3.5 - 5.1 mEq/L   Chloride 102  96 - 112 mEq/L   CO2 26  19 - 32 mEq/L   Glucose, Bld 88  70 - 99 mg/dL   BUN 10  6 - 23 mg/dL   Creatinine, Ser 0.83  0.50 - 1.10 mg/dL   Calcium 9.1  8.4 - 10.5 mg/dL   Total Protein 6.5  6.0 - 8.3 g/dL   Albumin 3.1 (*) 3.5 - 5.2 g/dL   AST 18  0 - 37 U/L   ALT 19  0 - 35 U/L   Alkaline Phosphatase 57  39 - 117 U/L   Total Bilirubin 0.4  0.3 - 1.2 mg/dL   GFR calc non Af Amer 83 (*) >90 mL/min   GFR calc Af Amer >90  >90 mL/min  URINE MICROSCOPIC-ADD ON      Result Value Range   RBC / HPF 3-6  <3 RBC/hpf   Bacteria, UA FEW (*) RARE   Laboratory interpretation all normal except hypokalemia     Imaging Review No results found.  AP CT today pending  Ct Abdomen Pelvis W Contrast  03/18/2013   IMPRESSION: Moderate bowel wall thickening involving the terminal ileum and distal ileum over a length of approximately 20 cm, with mild right lower quadrant mesenteric adenopathy. Differential diagnosis includes Crohn's disease, infectious enteritis, or  less likely lymphoma. No evidence of small bowel obstruction or abscess.  11.5 cm posterior uterine fibroid with cystic degeneration.  Mild biliary ductal dilatation. No etiology apparent by CT, and this may be related to prior cholecystectomy. Recommend correlation with liver function tests, and consider MRCP for further evaluation if clinically warranted.   Electronically Signed   By: Earle Gell M.D.   On: 03/18/2013 18:36   EKG Interpretation   None       MDM   1. RLQ abdominal pain   2. Hypokalemia     Disposition pending   Rolland Porter, MD, Alanson Aly, MD 03/30/13 2152

## 2013-03-31 MED ORDER — AMOXICILLIN-POT CLAVULANATE 875-125 MG PO TABS
1.0000 | ORAL_TABLET | Freq: Once | ORAL | Status: AC
  Administered 2013-03-31: 1 via ORAL
  Filled 2013-03-31: qty 1

## 2013-03-31 MED ORDER — AMOXICILLIN-POT CLAVULANATE 875-125 MG PO TABS: 1.0000 | ORAL_TABLET | Freq: Two times a day (BID) | ORAL | Status: DC

## 2013-03-31 MED ORDER — TRAMADOL HCL 50 MG PO TABS: 50.0000 mg | ORAL_TABLET | Freq: Four times a day (QID) | ORAL | Status: DC | PRN

## 2013-03-31 MED ORDER — ONDANSETRON 4 MG PO TBDP: 4.0000 mg | ORAL_TABLET | Freq: Three times a day (TID) | ORAL | Status: DC | PRN

## 2013-03-31 NOTE — ED Provider Notes (Signed)
Patient evaluated, pain is under good control, labs normal and CT scan with essentially no significant changes. Discussed with patient the utility of using antibiotics versus steroids at this time, she desires to have surgery early this week for a hysterectomy, I would be concerned that putting her on steroids may jeopardize this if she truly has an infectious colitis. We'll start with Augmentin, followup on Monday with gastroenterology, patient states appointment is already made.  She expresses understanding for the indications for return.  Johnna Acosta, MD 03/31/13 [phone]

## 2013-04-01 ENCOUNTER — Telehealth: Payer: Self-pay | Admitting: Gastroenterology

## 2013-04-01 NOTE — Telephone Encounter (Signed)
Pt called this morning to confirm if she had URG OV with SF this morning per RMR. RMR had sent something in my basket about patient may need URG OV with SF the first of the week, but patient said she is feeling better and has been taking antibiotics. She feels if she could just talk with the nurse or SF to touch base with what's going on she would be OK. She doesn't feel like she needs an OV at this time. Please advise and call patient at [phone]

## 2013-04-01 NOTE — Telephone Encounter (Signed)
REVIEWED.  

## 2013-04-01 NOTE — Telephone Encounter (Signed)
The patient CAN CALL BACK IF SHE NEEDS TO BE SEEN.

## 2013-04-01 NOTE — Telephone Encounter (Signed)
Patient is feeling better and said she has been taking antibiotics. She doesn't feel the need of an OV at this time and would rather have nurse or SF call her to touch base. I sent DS and SF a phone note to advise.

## 2013-04-01 NOTE — Telephone Encounter (Signed)
Called pt. Many rings and no answer.

## 2013-04-01 NOTE — H&P (Signed)
Renee Atkinson is an 47 y.o. female. She is admitted for TAH BSO for large uterine myoma that was not noted on pelvic exam by her previous gyn less than 1 year ago.  Her [place] is in the menopausal range.   Pertinent Gynecological History:  OB History: G2, P2   Menstrual History: No LMP recorded. Patient is postmenopausal.    Past Medical History  Diagnosis Date  . Depression   . GERD (gastroesophageal reflux disease)   . SVD (spontaneous vaginal delivery)     x 2  . Insomnia     Past Surgical History  Procedure Laterality Date  . Cholecystectomy    . Colonoscopy N/A 03/18/2013    Procedure: COLONOSCOPY;  Surgeon: Danie Binder, MD;  [facility]: [facility];  Service: Endoscopy;  Laterality: N/A;  2:15PM  . Tubal ligation      Family History  Problem Relation Age of Onset  . Inflammatory bowel disease Neg Hx   . Colon cancer Neg Hx   . Prostate cancer Father   . Diverticulitis Mother     complicated, colostomy bag temporarily    Social History:  reports that she has been smoking Cigarettes.  She has a 7.5 pack-year smoking history. She has never used smokeless tobacco. She reports that she does not drink alcohol or use illicit drugs.  Allergies:  Allergies  Allergen Reactions  . Dilaudid [Hydromorphone Hcl] Other (See Comments)    Severe headaches  . Morphine And Related Nausea And Vomiting    No prescriptions prior to admission    Review of Systems  Constitutional: Negative.   HENT: Negative.   Respiratory: Negative.   Cardiovascular: Negative.   Gastrointestinal: Positive for abdominal pain.  Genitourinary: Negative.   Musculoskeletal: Negative.     There were no vitals taken for this visit. Physical Exam  Constitutional: She is oriented to person, place, and time. She appears well-developed and well-nourished.  HENT:  Head: Normocephalic.  Eyes: Pupils are equal, round, and reactive to light.  Neck: Normal range of motion. No thyromegaly present.   Cardiovascular: Normal rate.   Respiratory: Effort normal.  GI: Soft.  Genitourinary: Vagina normal.  9 cm posterior uterine myoma.  Musculoskeletal: Normal range of motion.  Neurological: She is alert and oriented to person, place, and time.  Skin: Skin is warm and dry.  Psychiatric: She has a normal mood and affect.    No results found for this or any previous visit (from the past 24 hour(s)).  Ct Abdomen Pelvis W Contrast  03/30/2013   CLINICAL DATA:  Right lower quadrant abdominal pain.  EXAM: CT ABDOMEN AND PELVIS WITH CONTRAST  TECHNIQUE: Multidetector CT imaging of the abdomen and pelvis was performed using the standard protocol following bolus administration of intravenous contrast.  CONTRAST:  179m OMNIPAQUE IOHEXOL 300 MG/ML  SOLN  COMPARISON:  CT of the abdomen and pelvis performed 03/18/2013  FINDINGS: The visualized lung bases are clear.  The liver and spleen are unremarkable in appearance. The patient is status post cholecystectomy, with clips noted along the gallbladder fossa. The pancreas and adrenal glands are unremarkable. Prominence of the common hepatic duct is likely within normal limits status post cholecystectomy.  The kidneys are unremarkable in appearance. There is no evidence of hydronephrosis. No renal or ureteral stones are seen. No perinephric stranding is appreciated.  Diffuse wall thickening is again noted along the distal ileum bowel and terminal ileum; the distribution is mildly changed from the recent prior study, and  it involves a shorter segment of the distal ileum. However, adjacent mesenteric lymphadenopathy is slightly more prominent, with a 2.1 x 1.7 cm adjacent node now seen. This may reflect an infectious or inflammatory terminal ileitis; Crohn's disease cannot be excluded. Associated soft tissue inflammation extends about the adjacent sigmoid colon, with slightly increased inflammation along the sigmoid colon in comparison to the prior study.  No free fluid  is identified. The small bowel is unremarkable in appearance. The stomach is within normal limits. No acute vascular abnormalities are seen.  The appendix is normal in caliber and contains contrast and air, without evidence for appendicitis. Contrast progresses to the level of the descending colon. As noted above, there is mild soft tissue inflammation along the sigmoid colon, likely reactive secondary to the adjacent small bowel process. The colon is otherwise unremarkable in appearance.  The bladder is mildly distended and grossly unremarkable in appearance. As noted on prior studies, there is a large 11.5 cm posterior uterine fibroid, demonstrating cystic degeneration. The uterus is otherwise grossly unremarkable in appearance. The ovaries are relatively symmetric, though difficult to fully assess given the large fibroid. No inguinal lymphadenopathy is seen.  No acute osseous abnormalities are identified.  IMPRESSION: 1. Diffuse wall thickening again noted along the distal and terminal ileum; the distribution is mildly changed from the recent prior study, and inflammation involves a shorter segment of the distal ileum. However, adjacent mesenteric lymphadenopathy and soft tissue inflammation is slightly more prominent than on the prior study, with a 1.7 cm adjacent node. This may reflect infectious or inflammatory terminal ileitis; as before, Crohn's disease is a concern. Associated soft tissue inflammation extends about the adjacent sigmoid colon. 2. Large 11.5 cm posterior uterine fibroid again noted, with cystic degeneration.   Electronically Signed   By: Garald Balding M.D.   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  Because of the small but real risk that this may be a leiomyosarcoma the decision was made to proceed with TAH rather than attempt to do a vaginal hysterectomy with morcellation.  We will inspect the  terminal ileum during surgery which appeared abnormal on colonoscopy.  Bhargav Barbaro D 04/01/2013, 10:22 AM

## 2013-04-02 ENCOUNTER — Encounter (HOSPITAL_COMMUNITY): Payer: BC Managed Care – PPO | Admitting: Anesthesiology

## 2013-04-02 ENCOUNTER — Encounter (HOSPITAL_COMMUNITY)
Admission: RE | Disposition: A | Discharge status: Home / Self Care | Payer: Self-pay | Source: Ambulatory Visit | Attending: Obstetrics & Gynecology

## 2013-04-02 ENCOUNTER — Encounter (HOSPITAL_COMMUNITY): Payer: Self-pay | Admitting: *Deleted

## 2013-04-02 ENCOUNTER — Inpatient Hospital Stay (HOSPITAL_COMMUNITY)
Admission: RE | Admit: 2013-04-02 | Discharge: 2013-04-03 | DRG: 743 | Disposition: A | Discharge status: Home / Self Care | Payer: BC Managed Care – PPO | Source: Ambulatory Visit | Attending: Obstetrics & Gynecology | Admitting: Obstetrics & Gynecology

## 2013-04-02 ENCOUNTER — Inpatient Hospital Stay (HOSPITAL_COMMUNITY): Payer: BC Managed Care – PPO | Admitting: Anesthesiology

## 2013-04-02 DIAGNOSIS — Z9089 Acquired absence of other organs: Secondary | ICD-10-CM

## 2013-04-02 DIAGNOSIS — Z90710 Acquired absence of both cervix and uterus: Secondary | ICD-10-CM

## 2013-04-02 DIAGNOSIS — R1031 Right lower quadrant pain: Secondary | ICD-10-CM | POA: Diagnosis present

## 2013-04-02 DIAGNOSIS — K639 Disease of intestine, unspecified: Secondary | ICD-10-CM | POA: Diagnosis present

## 2013-04-02 DIAGNOSIS — N8000 Endometriosis of the uterus, unspecified: Secondary | ICD-10-CM | POA: Diagnosis present

## 2013-04-02 DIAGNOSIS — D259 Leiomyoma of uterus, unspecified: Principal | ICD-10-CM | POA: Diagnosis present

## 2013-04-02 DIAGNOSIS — Z9851 Tubal ligation status: Secondary | ICD-10-CM

## 2013-04-02 DIAGNOSIS — F17200 Nicotine dependence, unspecified, uncomplicated: Secondary | ICD-10-CM | POA: Diagnosis present

## 2013-04-02 HISTORY — PX: ABDOMINAL HYSTERECTOMY: SHX81

## 2013-04-02 HISTORY — PX: SALPINGOOPHORECTOMY: SHX82

## 2013-04-02 SURGERY — HYSTERECTOMY, ABDOMINAL
Anesthesia: General | Site: Pelvis

## 2013-04-02 MED ORDER — KETOROLAC TROMETHAMINE 30 MG/ML IJ SOLN
30.0000 mg | Freq: Four times a day (QID) | INTRAMUSCULAR | Status: DC
  Administered 2013-04-02 – 2013-04-03 (×3): 30 mg via INTRAVENOUS
  Filled 2013-04-02 (×3): qty 1

## 2013-04-02 MED ORDER — CEFAZOLIN SODIUM-DEXTROSE 2-3 GM-% IV SOLR
2.0000 g | INTRAVENOUS | Status: AC
  Administered 2013-04-02: 2 g via INTRAVENOUS

## 2013-04-02 MED ORDER — FENTANYL 10 MCG/ML IV SOLN
INTRAVENOUS | Status: DC
  Administered 2013-04-02: 15 ug/h via INTRAVENOUS
  Filled 2013-04-02: qty 50

## 2013-04-02 MED ORDER — FENTANYL CITRATE 0.05 MG/ML IJ SOLN
INTRAMUSCULAR | Status: DC | PRN
  Administered 2013-04-02: 50 ug via INTRAVENOUS
  Administered 2013-04-02 (×2): 100 ug via INTRAVENOUS
  Administered 2013-04-02 (×5): 50 ug via INTRAVENOUS

## 2013-04-02 MED ORDER — DIPHENHYDRAMINE HCL 12.5 MG/5ML PO ELIX
12.5000 mg | ORAL_SOLUTION | Freq: Four times a day (QID) | ORAL | Status: DC | PRN
  Administered 2013-04-02: 12.5 mg via ORAL
  Filled 2013-04-02 (×2): qty 5

## 2013-04-02 MED ORDER — FENTANYL CITRATE 0.05 MG/ML IJ SOLN
INTRAMUSCULAR | Status: AC
  Filled 2013-04-02: qty 2

## 2013-04-02 MED ORDER — FENTANYL CITRATE 0.05 MG/ML IJ SOLN
INTRAMUSCULAR | Status: AC
  Administered 2013-04-02 (×2): 50 ug
  Filled 2013-04-02: qty 2

## 2013-04-02 MED ORDER — OXYCODONE-ACETAMINOPHEN 5-325 MG PO TABS
1.0000 | ORAL_TABLET | ORAL | Status: DC | PRN
Start: 2013-04-02 — End: 2013-04-03
  Administered 2013-04-02 – 2013-04-03 (×3): 2 via ORAL
  Filled 2013-04-02 (×3): qty 2

## 2013-04-02 MED ORDER — ONDANSETRON HCL 4 MG/2ML IJ SOLN: 4.0000 mg | Freq: Four times a day (QID) | INTRAMUSCULAR | Status: DC | PRN

## 2013-04-02 MED ORDER — KETOROLAC TROMETHAMINE 30 MG/ML IJ SOLN: 15.0000 mg | Freq: Once | INTRAMUSCULAR | Status: DC | PRN

## 2013-04-02 MED ORDER — KETOROLAC TROMETHAMINE 30 MG/ML IJ SOLN: 30.0000 mg | Freq: Four times a day (QID) | INTRAMUSCULAR | Status: DC

## 2013-04-02 MED ORDER — OXYCODONE-ACETAMINOPHEN 5-325 MG PO TABS
2.0000 | ORAL_TABLET | Freq: Once | ORAL | Status: AC
  Administered 2013-04-02: 2 via ORAL

## 2013-04-02 MED ORDER — DEXAMETHASONE SODIUM PHOSPHATE 10 MG/ML IJ SOLN
INTRAMUSCULAR | Status: AC
  Filled 2013-04-02: qty 1

## 2013-04-02 MED ORDER — OXYCODONE-ACETAMINOPHEN 5-325 MG PO TABS
ORAL_TABLET | ORAL | Status: AC
  Administered 2013-04-02: 2 via ORAL
  Filled 2013-04-02: qty 2

## 2013-04-02 MED ORDER — MEPERIDINE HCL 25 MG/ML IJ SOLN: 6.2500 mg | INTRAMUSCULAR | Status: DC | PRN

## 2013-04-02 MED ORDER — SODIUM CHLORIDE 0.9 % IJ SOLN: 9.0000 mL | INTRAMUSCULAR | Status: DC | PRN

## 2013-04-02 MED ORDER — MIDAZOLAM HCL 5 MG/5ML IJ SOLN
INTRAMUSCULAR | Status: DC | PRN
  Administered 2013-04-02: 2 mg via INTRAVENOUS

## 2013-04-02 MED ORDER — HEPARIN SODIUM (PORCINE) 5000 UNIT/ML IJ SOLN
INTRAMUSCULAR | Status: AC
  Filled 2013-04-02: qty 1

## 2013-04-02 MED ORDER — FENTANYL CITRATE 0.05 MG/ML IJ SOLN
25.0000 ug | INTRAMUSCULAR | Status: AC | PRN
  Administered 2013-04-02: 25 ug via INTRAVENOUS
  Administered 2013-04-02 (×2): 50 ug via INTRAVENOUS
  Administered 2013-04-02 (×3): 25 ug via INTRAVENOUS

## 2013-04-02 MED ORDER — DEXAMETHASONE SODIUM PHOSPHATE 4 MG/ML IJ SOLN: 8.0000 mg | Freq: Once | INTRAMUSCULAR | Status: DC | PRN

## 2013-04-02 MED ORDER — ONDANSETRON HCL 4 MG/2ML IJ SOLN
INTRAMUSCULAR | Status: AC
  Filled 2013-04-02: qty 2

## 2013-04-02 MED ORDER — ROCURONIUM BROMIDE 100 MG/10ML IV SOLN
INTRAVENOUS | Status: DC | PRN
  Administered 2013-04-02: 50 mg via INTRAVENOUS

## 2013-04-02 MED ORDER — LACTATED RINGERS IV SOLN
INTRAVENOUS | Status: DC
  Administered 2013-04-02 (×3): via INTRAVENOUS

## 2013-04-02 MED ORDER — IBUPROFEN 600 MG PO TABS: 600.0000 mg | ORAL_TABLET | Freq: Four times a day (QID) | ORAL | Status: DC | PRN

## 2013-04-02 MED ORDER — CLONAZEPAM 0.5 MG PO TABS
0.5000 mg | ORAL_TABLET | Freq: Every day | ORAL | Status: DC
  Administered 2013-04-02: 0.5 mg via ORAL
  Filled 2013-04-02: qty 1

## 2013-04-02 MED ORDER — KETOROLAC TROMETHAMINE 30 MG/ML IJ SOLN
INTRAMUSCULAR | Status: DC | PRN
  Administered 2013-04-02: 30 mg via INTRAVENOUS

## 2013-04-02 MED ORDER — MIDAZOLAM HCL 2 MG/2ML IJ SOLN
INTRAMUSCULAR | Status: AC
  Filled 2013-04-02: qty 2

## 2013-04-02 MED ORDER — NALOXONE HCL 0.4 MG/ML IJ SOLN: 0.4000 mg | INTRAMUSCULAR | Status: DC | PRN

## 2013-04-02 MED ORDER — LABETALOL HCL 5 MG/ML IV SOLN
INTRAVENOUS | Status: AC
  Filled 2013-04-02: qty 4

## 2013-04-02 MED ORDER — PANTOPRAZOLE SODIUM 40 MG PO TBEC
40.0000 mg | DELAYED_RELEASE_TABLET | Freq: Every day | ORAL | Status: DC
  Administered 2013-04-02 – 2013-04-03 (×2): 40 mg via ORAL
  Filled 2013-04-02 (×3): qty 1

## 2013-04-02 MED ORDER — NEOSTIGMINE METHYLSULFATE 1 MG/ML IJ SOLN
INTRAMUSCULAR | Status: DC | PRN
  Administered 2013-04-02: 4 mg via INTRAVENOUS

## 2013-04-02 MED ORDER — DIPHENHYDRAMINE HCL 50 MG/ML IJ SOLN: 12.5000 mg | Freq: Four times a day (QID) | INTRAMUSCULAR | Status: DC | PRN

## 2013-04-02 MED ORDER — GLYCOPYRROLATE 0.2 MG/ML IJ SOLN
INTRAMUSCULAR | Status: DC | PRN
  Administered 2013-04-02: .6 mg via INTRAVENOUS

## 2013-04-02 MED ORDER — LIDOCAINE HCL (CARDIAC) 20 MG/ML IV SOLN
INTRAVENOUS | Status: DC | PRN
  Administered 2013-04-02: 100 mg via INTRAVENOUS

## 2013-04-02 MED ORDER — HYDROMORPHONE HCL PF 1 MG/ML IJ SOLN: 0.2000 mg | INTRAMUSCULAR | Status: DC | PRN

## 2013-04-02 MED ORDER — CITALOPRAM HYDROBROMIDE 40 MG PO TABS
40.0000 mg | ORAL_TABLET | Freq: Every day | ORAL | Status: DC
  Administered 2013-04-02: 40 mg via ORAL
  Filled 2013-04-02 (×2): qty 1

## 2013-04-02 MED ORDER — FENTANYL CITRATE 0.05 MG/ML IJ SOLN
INTRAMUSCULAR | Status: AC
  Filled 2013-04-02: qty 5

## 2013-04-02 MED ORDER — DEXTROSE-NACL 5-0.45 % IV SOLN
INTRAVENOUS | Status: DC
  Administered 2013-04-02: 17:00:00 via INTRAVENOUS

## 2013-04-02 MED ORDER — DEXAMETHASONE SODIUM PHOSPHATE 4 MG/ML IJ SOLN
INTRAMUSCULAR | Status: DC | PRN
  Administered 2013-04-02: 10 mg via INTRAVENOUS

## 2013-04-02 MED ORDER — LABETALOL HCL 5 MG/ML IV SOLN
INTRAVENOUS | Status: DC | PRN
  Administered 2013-04-02 (×2): 5 mg via INTRAVENOUS

## 2013-04-02 MED ORDER — CEFAZOLIN SODIUM-DEXTROSE 2-3 GM-% IV SOLR
INTRAVENOUS | Status: AC
  Filled 2013-04-02: qty 50

## 2013-04-02 MED ORDER — PROPOFOL 10 MG/ML IV EMUL
INTRAVENOUS | Status: AC
  Filled 2013-04-02: qty 20

## 2013-04-02 MED ORDER — ONDANSETRON HCL 4 MG/2ML IJ SOLN
INTRAMUSCULAR | Status: DC | PRN
  Administered 2013-04-02: 4 mg via INTRAVENOUS

## 2013-04-02 MED ORDER — LIDOCAINE HCL (CARDIAC) 20 MG/ML IV SOLN
INTRAVENOUS | Status: AC
  Filled 2013-04-02: qty 5

## 2013-04-02 MED ORDER — PNEUMOCOCCAL VAC POLYVALENT 25 MCG/0.5ML IJ INJ
0.5000 mL | INJECTION | INTRAMUSCULAR | Status: AC
  Administered 2013-04-03: 0.5 mL via INTRAMUSCULAR
  Filled 2013-04-02: qty 0.5

## 2013-04-02 MED ORDER — ROCURONIUM BROMIDE 100 MG/10ML IV SOLN
INTRAVENOUS | Status: AC
  Filled 2013-04-02: qty 1

## 2013-04-02 SURGICAL SUPPLY — 31 items
CANISTER SUCT 3000ML (MISCELLANEOUS) ×3 IMPLANT
CLOTH BEACON ORANGE TIMEOUT ST (SAFETY) ×3 IMPLANT
CONT PATH 16OZ SNAP LID 3702 (MISCELLANEOUS) ×3 IMPLANT
DECANTER SPIKE VIAL GLASS SM (MISCELLANEOUS) IMPLANT
DRAPE WARM FLUID 44X44 (DRAPE) IMPLANT
DRSG OPSITE POSTOP 4X10 (GAUZE/BANDAGES/DRESSINGS) ×1 IMPLANT
GAUZE SPONGE 4X4 16PLY XRAY LF (GAUZE/BANDAGES/DRESSINGS) ×3 IMPLANT
GLOVE ECLIPSE 6.0 STRL STRAW (GLOVE) ×3 IMPLANT
GLOVE ECLIPSE 6.5 STRL STRAW (GLOVE) ×3 IMPLANT
GOWN PREVENTION PLUS LG XLONG (DISPOSABLE) ×9 IMPLANT
NDL HYPO 25X1 1.5 SAFETY (NEEDLE) IMPLANT
NEEDLE HYPO 25X1 1.5 SAFETY (NEEDLE) IMPLANT
NS IRRIG 1000ML POUR BTL (IV SOLUTION) ×3 IMPLANT
PACK ABDOMINAL GYN (CUSTOM PROCEDURE TRAY) ×3 IMPLANT
PAD OB MATERNITY 4.3X12.25 (PERSONAL CARE ITEMS) ×3 IMPLANT
PROTECTOR NERVE ULNAR (MISCELLANEOUS) ×3 IMPLANT
SPONGE LAP 18X18 X RAY DECT (DISPOSABLE) ×6 IMPLANT
STAPLER VISISTAT 35W (STAPLE) ×3 IMPLANT
SUT MNCRL 0 MO-4 VIOLET 18 CR (SUTURE) ×4 IMPLANT
SUT MNCRL 0 VIOLET 6X18 (SUTURE) ×2 IMPLANT
SUT MONOCRYL 0 6X18 (SUTURE) ×1
SUT MONOCRYL 0 MO 4 18  CR/8 (SUTURE) ×2
SUT VIC AB 0 CT1 27 (SUTURE) ×9
SUT VIC AB 0 CT1 27XBRD ANBCTR (SUTURE) ×6 IMPLANT
SUT VIC AB 3-0 SH 27 (SUTURE) ×3
SUT VIC AB 3-0 SH 27X BRD (SUTURE) ×2 IMPLANT
SUT VIC AB 4-0 KS 27 (SUTURE) ×4 IMPLANT
SYR CONTROL 10ML LL (SYRINGE) IMPLANT
TOWEL OR 17X24 6PK STRL BLUE (TOWEL DISPOSABLE) ×6 IMPLANT
TRAY FOLEY CATH 14FR (SET/KITS/TRAYS/PACK) ×3 IMPLANT
WATER STERILE IRR 1000ML POUR (IV SOLUTION) ×3 IMPLANT

## 2013-04-02 NOTE — Transfer of Care (Signed)
Immediate Anesthesia Transfer of Care Note  Patient: Renee Atkinson  Procedure(s) Performed: Procedure(s): HYSTERECTOMY ABDOMINAL (N/A) SALPINGO OOPHORECTOMY (Bilateral)  Patient Location: PACU  Anesthesia Type:General  Level of Consciousness: awake, alert  and oriented  Airway & Oxygen Therapy: Patient Spontanous Breathing and Patient connected to nasal cannula oxygen  Post-op Assessment: Report given to PACU RN and Post -op Vital signs reviewed and stable  Post vital signs: Reviewed and stable  Complications: No apparent anesthesia complications

## 2013-04-02 NOTE — Anesthesia Postprocedure Evaluation (Signed)
  Anesthesia Post-op Note  Patient: Renee Atkinson  Procedure(s) Performed: Procedure(s): HYSTERECTOMY ABDOMINAL (N/A) SALPINGO OOPHORECTOMY (Bilateral)  Patient Location: PACU  Anesthesia Type:General  Level of Consciousness: awake, alert  and oriented  Airway and Oxygen Therapy: Patient Spontanous Breathing  Post-op Pain: mild  Post-op Assessment: Post-op Vital signs reviewed, Patient's Cardiovascular Status Stable, Respiratory Function Stable, Patent Airway, No signs of Nausea or vomiting and Pain level controlled  Post-op Vital Signs: Reviewed and stable  Complications: No apparent anesthesia complications

## 2013-04-02 NOTE — Op Note (Signed)
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 Deatra Ina M.D.  ASSISTANT: Barbaraann Rondo, M.D.  ANESTHESIA: General endotracheal  ESTIMATED BLOOD LOSS: 200 ml  FINDINGS: 10 cm posterior uterine myoma.  No pelvic adhesions.  Normal postmenopausal ovaries and tubes.  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   COMPLICATIONS: None  INDICATIONS: Newly diagnosed and large uterine myoma in a post menopausal woman.  Options discussed with patient who requested abdominal hysterectomy and prophylactic BSO.   PROCEDURE IN DETAIL:  The abdomen and vagina were prepped and draped in a sterile fashion.  A low transverse incision was made and carried down to the fascia which was opened transversely.  The rectus muscle was dissected free inferiorly and superiorly and then opened in the midline.  The peritoneum was opened by blunt dissection.  The bowel was packed out of the pelvis and the self retaining O'Connor retractor was placed.  The round ligaments were cut and the bladder plane was created.  The infundibular ligaments were isolated and doubly ligated.  The uterine arteries were clamped, cut and ligated and the cardinal ligaments were dissected free from the cervix.  The cervix was amputated with about 75% left with the uterus and the specimen was removed.  The remainder of the cervix was then removed and the vaginal cuff was closed. The bowel was explored and a 4 cm inflammatory mass was noted at the terminal ileum.  The appendix was uninvolved and uninflammed.  Intraoperatively a general surgeon was consulted by telephone. He reviewed the CT reports and felt that no bowel surgery was indicated at this  time with the findings I described.  The bowel packs and retractor were removed and the peritoneum and rectus muscle was closed with running 3-0 vicryl suture.  The Fascia was closed with 0 Vicryl suture.  The skin was closed with 4-0 vicryl.  The patient tolerated the procedure well and left the operating room in good condition.  All sponge and instrument counts were correct.

## 2013-04-02 NOTE — Anesthesia Preprocedure Evaluation (Addendum)
Anesthesia Evaluation  Patient identified by MRN, date of birth, ID band Patient awake    Reviewed: Allergy & Precautions, H&P , NPO status , Patient's Chart, lab work & pertinent test results  Airway Mallampati: I TM Distance: >3 FB Neck ROM: full    Dental no notable dental hx. (+) Teeth Intact   Pulmonary Current Smoker,    Pulmonary exam normal       Cardiovascular negative cardio ROS      Neuro/Psych negative neurological ROS     GI/Hepatic Neg liver ROS, GERD-  Medicated and Controlled,  Endo/Other  negative endocrine ROS  Renal/GU negative Renal ROS  negative genitourinary   Musculoskeletal   Abdominal Normal abdominal exam  (+)   Peds  Hematology negative hematology ROS (+)   Anesthesia Other Findings   Reproductive/Obstetrics negative OB ROS                           Anesthesia Physical Anesthesia Plan  ASA: II  Anesthesia Plan: General   Post-op Pain Management:    Induction: Intravenous  Airway Management Planned: Oral ETT  Additional Equipment:   Intra-op Plan:   Post-operative Plan: Extubation in OR  Informed Consent: I have reviewed the patients History and Physical, chart, labs and discussed the procedure including the risks, benefits and alternatives for the proposed anesthesia with the patient or authorized representative who has indicated his/her understanding and acceptance.   Dental Advisory Given  Plan Discussed with: CRNA and Surgeon  Anesthesia Plan Comments:         Anesthesia Quick Evaluation

## 2013-04-02 NOTE — Progress Notes (Signed)
I have interviewed and performed the pertinent exams on my patient to confirm that there have been no significant changes in her condition since the dictation of her history and physical exam.

## 2013-04-02 NOTE — Anesthesia Procedure Notes (Signed)
Procedure Name: Intubation Date/Time: 04/02/2013 12:19 PM Performed by: Gilmer Mor R Pre-anesthesia Checklist: Patient identified, Patient being monitored, Emergency Drugs available, Timeout performed and Suction available Patient Re-evaluated:Patient Re-evaluated prior to inductionOxygen Delivery Method: Circle system utilized Preoxygenation: Pre-oxygenation with 100% oxygen Intubation Type: IV induction Ventilation: Mask ventilation without difficulty Laryngoscope Size: Mac and 3 Grade View: Grade II Tube type: Oral Tube size: 7.0 mm Number of attempts: 3 Placement Confirmation: ETT inserted through vocal cords under direct vision,  positive ETCO2 and breath sounds checked- equal and bilateral Secured at: 21 cm Dental Injury: Teeth and Oropharynx as per pre-operative assessment

## 2013-04-02 NOTE — Telephone Encounter (Signed)
Called and pt's mother said she is in hospital in [place] having hysterectomy today. She will tell her to call GI if she needs to.

## 2013-04-03 ENCOUNTER — Encounter (HOSPITAL_COMMUNITY): Payer: Self-pay | Admitting: Obstetrics & Gynecology

## 2013-04-03 LAB — CBC
HCT: 31.7 % — ABNORMAL LOW (ref 36.0–46.0)
Hemoglobin: 10.6 g/dL — ABNORMAL LOW (ref 12.0–15.0)
MCH: 29.9 pg (ref 26.0–34.0)
MCHC: 33.4 g/dL (ref 30.0–36.0)
MCV: 89.5 fL (ref 78.0–100.0)
Platelets: 271 10*3/uL (ref 150–400)
RDW: 14 % (ref 11.5–15.5)

## 2013-04-03 MED ORDER — OXYCODONE-ACETAMINOPHEN 5-325 MG PO TABS
1.0000 | ORAL_TABLET | ORAL | Status: DC | PRN
  Administered 2013-04-03 (×2): 2 via ORAL
  Filled 2013-04-03 (×2): qty 2

## 2013-04-03 MED ORDER — OXYCODONE-ACETAMINOPHEN 5-325 MG PO TABS: 1.0000 | ORAL_TABLET | ORAL | Status: DC | PRN

## 2013-04-03 MED FILL — Heparin Sodium (Porcine) Inj 5000 Unit/ML: INTRAMUSCULAR | Qty: 1 | Status: AC

## 2013-04-03 NOTE — Discharge Summary (Addendum)
Physician Discharge Summary  Patient ID: Renee Atkinson MRN: [id number] DOB/AGE: 1965-11-20 47 y.o.  Admit date: 04/02/2013 Discharge date: 04/03/2013  Admission Diagnoses:  Myoma Uteri  Discharge Diagnoses: Myoma Uteri Active Problems:   S/P TAH-BSO   Discharged Condition: good  Hospital Course: Uncomplicated.    Consults: general surgery (intraoperative telephone consult about inflammatory bowel mass consistent with suspected Chron's disease).  Treatments: surgery: TAH BSO  Discharge Exam: Blood pressure 104/67, pulse 83, temperature 98.2 F (36.8 C), temperature source Oral, resp. rate 18, height 5' 4"  (1.626 m), weight 64.411 kg (142 lb), SpO2 100.00%. Incision/Wound: Uninflammed.  Abdomin soft.  Good bowel sounds  Disposition: 01-Home or Self Care     Medication List    STOP taking these medications       traMADol 50 MG tablet  Commonly known as:  ULTRAM      TAKE these medications       amoxicillin-clavulanate 875-125 MG per tablet  Commonly known as:  AUGMENTIN  Take 1 tablet by mouth every 12 (twelve) hours.     citalopram 40 MG tablet  Commonly known as:  CELEXA  Take 40 mg by mouth at bedtime.     clonazePAM 0.5 MG tablet  Commonly known as:  KLONOPIN  Take 0.5 mg by mouth at bedtime.     esomeprazole 20 MG capsule  Commonly known as:  NEXIUM  Take 20 mg by mouth at bedtime.     ondansetron 4 MG disintegrating tablet  Commonly known as:  ZOFRAN ODT  Take 1 tablet (4 mg total) by mouth every 8 (eight) hours as needed for nausea.     oxyCODONE-acetaminophen 5-325 MG per tablet  Commonly known as:  PERCOCET/ROXICET  Take 1-2 tablets by mouth every 3 (three) hours as needed for severe pain (moderate to severe pain (when tolerating fluids)).           Follow-up Information   Follow up with Sharene Butters, MD. Schedule an appointment as soon as possible for a visit in 4 weeks.   Specialty:  Obstetrics and Gynecology   Contact information:    [address] [place] [address] [phone]       Signed: Sharene Butters 04/03/2013, 4:27 PM

## 2013-04-03 NOTE — Progress Notes (Signed)
Fentanyl PCA 34 cc wasted down the sink.  Witnessed by [employer] Day RN

## 2013-04-03 NOTE — Progress Notes (Signed)
Post Op Day 1 Subjective: no complaints, up ad lib, voiding and tolerating PO  Objective: Blood pressure 103/67, pulse 80, temperature 98.3 F (36.8 C), temperature source Oral, resp. rate 16, height 5' 4"  (1.626 m), weight 64.411 kg (142 lb), SpO2 99.00%.  Physical Exam:  GI: soft, non-tender; bowel sounds normal; no masses,  no organomegaly Incision: no significant drainage   Recent Labs  04/03/13 0530  HGB 10.6*  HCT 31.7*    Assessment/Plan: Plan for discharge tomorrow (patient may choose to go home later today if she tolerates solid food and her pain is controlled with Percocet).   LOS: 1 day   Renee Atkinson 04/03/2013, 9:07 AM

## 2013-04-03 NOTE — Anesthesia Postprocedure Evaluation (Signed)
  Anesthesia Post-op Note  Anesthesia Post Note  Patient: Renee Atkinson  Procedure(s) Performed: Procedure(s) (LRB): HYSTERECTOMY ABDOMINAL (N/A) SALPINGO OOPHORECTOMY (Bilateral)  Anesthesia type: General  Patient location: [facility]  Post pain: Pain level controlled  Post assessment: Post-op Vital signs reviewed  Last Vitals:  Filed Vitals:   04/03/13 0552  BP: 103/67  Pulse: 80  Temp: 36.8 C  Resp: 16    Post vital signs: Reviewed  Level of consciousness: sedated  Complications: No apparent anesthesia complications

## 2013-04-03 NOTE — Progress Notes (Signed)
Ur chart review completed.  

## 2013-04-03 NOTE — Progress Notes (Signed)
Patient request discharge.  She is tolerating oral intake, voiding, and her pain is controlled with Percocet.  I feel she is OK for discharge at this time.

## 2013-04-15 ENCOUNTER — Telehealth: Payer: Self-pay

## 2013-04-15 NOTE — Telephone Encounter (Signed)
Pt called and said she thought she was going to be put on meds for Crohn's after her hysterectomy ( which was done on 04/02/2013). Please advise!  She is aware that DR. Fields is off all week, will be at the hospital next on 04/22/2013 and will see messages then.  She is aware this is non-urgent and ok to wait for Dr. [name] to review and reply.

## 2013-04-16 NOTE — Telephone Encounter (Signed)
Called, many rings and no answer.

## 2013-04-16 NOTE — Telephone Encounter (Signed)
PLEASE CALL PT. SHE NEEDS TO BE RELEASED FROM DR. KAPLAN PRIOR TO STARTING MEDS FOR CROHN'S.

## 2013-04-23 NOTE — Telephone Encounter (Signed)
Pt returned call and was informed. She said it will be a few more weeks before she is released from Dr. Deatra Ina. She will let us know.

## 2013-05-06 ENCOUNTER — Telehealth: Payer: Self-pay | Admitting: *Deleted

## 2013-05-06 NOTE — Telephone Encounter (Addendum)
Pt called wanting to schedule a appointment with [facility] the next day available is 05/30/13. Pt can't wait that long pt states she had a historicime(HYSTERECTOMY) 04/02/13 and states Dr. [name] wanted to see her after that for her crons(CROHN'S). Pt states 05/30/13 is to long to go untreated. Please advise [phone]

## 2013-05-06 NOTE — Telephone Encounter (Signed)
PLEASE CALL PT. SCHEDULE FOR TCS WITH PROPOFOL DUE TO FAILED CONSCIOUS SEDATION NEXT TUES JAN 20 DX: ILEITIS. NEEDS ULTRASLIM TCS-MOVIPREP.

## 2013-05-06 NOTE — Telephone Encounter (Signed)
I called and spoke to pt. She said she was released from Dr. Deatra Ina today and she wants to see Dr. [name] ASAP so she can start meds for crohn's. Please advise!

## 2013-05-06 NOTE — Telephone Encounter (Signed)
I called pt on her cell ( [phone]) and informed. She has questions and would like to discuss with Dr. [name] before this is scheduled.

## 2013-05-07 ENCOUNTER — Telehealth: Payer: Self-pay | Admitting: *Deleted

## 2013-05-07 NOTE — Telephone Encounter (Signed)
Pt called stating she knows Dr. [name] is very busy and she is fine to go ahead and schedule her colonoscopy, without speaking with Dr. [name] first. Please advise

## 2013-05-08 ENCOUNTER — Encounter (HOSPITAL_COMMUNITY): Payer: Self-pay | Admitting: Pharmacy Technician

## 2013-05-08 ENCOUNTER — Other Ambulatory Visit: Payer: Self-pay | Admitting: Gastroenterology

## 2013-05-08 MED ORDER — PEG-KCL-NACL-NASULF-NA ASC-C 100 G PO SOLR: 1.0000 | ORAL | Status: DC

## 2013-05-08 NOTE — Telephone Encounter (Signed)
Dr. [name] spoke to pt on 05/08/2013.

## 2013-05-08 NOTE — Telephone Encounter (Signed)
MOVI PREP SPLIT DOSING, REGULAR BREAKFAST. CLEAR LIQUIDS AFTER 9 AM.

## 2013-05-08 NOTE — Telephone Encounter (Signed)
Prep done by Darius Bump.

## 2013-05-08 NOTE — Addendum Note (Signed)
Addended by: Everardo All on: 05/08/2013 02:29 PM   Modules accepted: Orders

## 2013-05-08 NOTE — Telephone Encounter (Addendum)
Called patient TO DISCUSS CONCERNS: 1. USE OF SMALLER TUBE & 2. INADEQUATE SEDATION. EXPLAINED TO PT USING THE ULTRASLIM SCOPE AND PROPOFOL. PT CONCERNED SHE WAS NOT ADEQUATELY SEDATED LAST TIME. REASSURED PT. AGREED TO TCS NEXT TUES.

## 2013-05-08 NOTE — Telephone Encounter (Signed)
Patient is scheduled on Tuesday Jan 20 w/SLF in OR and I have spoken to her as well as mailed her instructions

## 2013-05-08 NOTE — Telephone Encounter (Signed)
Gastroenterology Pre-Procedure Review  Request Date:  05/08/2013 Requesting Physician: Dr. Zelphia Atkinson has scheduled the pt for colonoscopy per Dr. [name] request.   PATIENT REVIEW QUESTIONS: The patient responded to the following health history questions as indicated:    1. Diabetes Melitis: no 2. Joint replacements in the past 12 months: no 3. Major health problems in the past 3 months: Hysterectomy  04/02/2013/ been released from Dr. Deatra Ina 4. Has an artificial valve or MVP: no 5. Has a defibrillator: no 6. Has been advised in past to take antibiotics in advance of a procedure like teeth cleaning: no    MEDICATIONS & ALLERGIES:    Patient reports the following regarding taking any blood thinners:   Plavix? no Aspirin? no Coumadin? no  Patient confirms/reports the following medications:  Current Outpatient Prescriptions  Medication Sig Dispense Refill  . citalopram (CELEXA) 40 MG tablet Take 40 mg by mouth at bedtime.      . clonazePAM (KLONOPIN) 0.5 MG tablet Take 0.5 mg by mouth at bedtime.      Marland Kitchen esomeprazole (NEXIUM) 20 MG capsule Take 20 mg by mouth at bedtime.      . peg 3350 powder (MOVIPREP) 100 G SOLR Take 1 kit (200 g total) by mouth as directed.  1 kit  0   No current facility-administered medications for this visit.    Patient confirms/reports the following allergies:  Allergies  Allergen Reactions  . Dilaudid [Hydromorphone Hcl] Other (See Comments)    Severe headaches  . Morphine And Related Nausea And Vomiting    No orders of the defined types were placed in this encounter.    AUTHORIZATION INFORMATION Primary Insurance:  ID #:   Group #:  Pre-Cert / Auth required: Pre-Cert / Auth #:   Secondary Insurance:   ID #:   Group #: Pre-Cert / Auth required: Pre-Cert / Auth #:   SCHEDULE INFORMATION: Procedure has been scheduled as follows:  Date:                     Time:   Location:   This Gastroenterology Pre-Precedure Review Form is being  routed to the following provider(s): Barney Drain, MD

## 2013-05-09 NOTE — Patient Instructions (Signed)
Renee Atkinson  05/09/2013   Your procedure is scheduled on:  05/14/2013  Report to [facility] at  37  AM.  Call this number if you have problems the morning of surgery: [phone]   Remember:   Do not eat food or drink liquids after midnight.   Take these medicines the morning of surgery with A SIP OF WATER:  Celexa, klonopin, nexium   Do not wear jewelry, make-up or nail polish.  Do not wear lotions, powders, or perfumes.   Do not shave 48 hours prior to surgery. Men may shave face and neck.  Do not bring valuables to the hospital.  [facility] is not responsible for any belongings or valuables.               Contacts, dentures or bridgework may not be worn into surgery.  Leave suitcase in the car. After surgery it may be brought to your room.  For patients admitted to the hospital, discharge time is determined by your treatment team.               Patients discharged the day of surgery will not be allowed to drive home.  Name and phone number of your driver: family  Special Instructions: N/A   Please read over the following fact sheets that you were given: Pain Booklet, Coughing and Deep Breathing, Surgical Site Infection Prevention, Anesthesia Post-op Instructions and Care and Recovery After Surgery Colonoscopy A colonoscopy is an exam to look at the entire large intestine (colon). This exam can help find problems such as tumors, polyps, inflammation, and areas of bleeding. The exam takes about 1 hour.  LET [facility] CARE PROVIDER KNOW ABOUT:   Any allergies you have.  All medicines you are taking, including vitamins, herbs, eye drops, creams, and over-the-counter medicines.  Previous problems you or members of your family have had with the use of anesthetics.  Any blood disorders you have.  Previous surgeries you have had.  Medical conditions you have. RISKS AND COMPLICATIONS  Generally, this is a safe procedure. However, as with any procedure,  complications can occur. Possible complications include:  Bleeding.  Tearing or rupture of the colon wall.  Reaction to medicines given during the exam.  Infection (rare). BEFORE THE PROCEDURE   Ask your health care provider about changing or stopping your regular medicines.  You may be prescribed an oral bowel prep. This involves drinking a large amount of medicated liquid, starting the day before your procedure. The liquid will cause you to have multiple loose stools until your stool is almost clear or light green. This cleans out your colon in preparation for the procedure.  Do not eat or drink anything else once you have started the bowel prep, unless your health care provider tells you it is safe to do so.  Arrange for someone to drive you home after the procedure. PROCEDURE   You will be given medicine to help you relax (sedative).  You will lie on your side with your knees bent.  A long, flexible tube with a light and camera on the end (colonoscope) will be inserted through the rectum and into the colon. The camera sends video back to a computer screen as it moves through the colon. The colonoscope also releases carbon dioxide gas to inflate the colon. This helps your health care provider see the area better.  During the exam, your health care provider may take a small tissue sample (  biopsy) to be examined under a microscope if any abnormalities are found.  The exam is finished when the entire colon has been viewed. AFTER THE PROCEDURE   Do not drive for 24 hours after the exam.  You may have a small amount of blood in your stool.  You may pass moderate amounts of gas and have mild abdominal cramping or bloating. This is caused by the gas used to inflate your colon during the exam.  Ask when your test results will be ready and how you will get your results. Make sure you get your test results. Document Released: 04/08/2000 Document Revised: 01/30/2013 Document Reviewed:  12/17/2012 [facility] Patient Information 2014 [place]. PATIENT INSTRUCTIONS POST-ANESTHESIA  IMMEDIATELY FOLLOWING SURGERY:  Do not drive or operate machinery for the first twenty four hours after surgery.  Do not make any important decisions for twenty four hours after surgery or while taking narcotic pain medications or sedatives.  If you develop intractable nausea and vomiting or a severe headache please notify your doctor immediately.  FOLLOW-UP:  Please make an appointment with your surgeon as instructed. You do not need to follow up with anesthesia unless specifically instructed to do so.  WOUND CARE INSTRUCTIONS (if applicable):  Keep a dry clean dressing on the anesthesia/puncture wound site if there is drainage.  Once the wound has quit draining you may leave it open to air.  Generally you should leave the bandage intact for twenty four hours unless there is drainage.  If the epidural site drains for more than 36-48 hours please call the anesthesia department.  QUESTIONS?:  Please feel free to call your physician or the hospital operator if you have any questions, and they will be happy to assist you.

## 2013-05-10 ENCOUNTER — Encounter (HOSPITAL_COMMUNITY): Payer: Self-pay

## 2013-05-10 ENCOUNTER — Encounter (HOSPITAL_COMMUNITY)
Admission: RE | Admit: 2013-05-10 | Discharge: 2013-05-10 | Disposition: A | Discharge status: Home / Self Care | Payer: BC Managed Care – PPO | Source: Ambulatory Visit | Attending: Gastroenterology | Admitting: Gastroenterology

## 2013-05-10 DIAGNOSIS — Z01812 Encounter for preprocedural laboratory examination: Secondary | ICD-10-CM | POA: Insufficient documentation

## 2013-05-10 LAB — HEMOGLOBIN AND HEMATOCRIT, BLOOD
HCT: 39.1 % (ref 36.0–46.0)
HEMOGLOBIN: 13.2 g/dL (ref 12.0–15.0)

## 2013-05-10 LAB — BASIC METABOLIC PANEL
BUN: 11 mg/dL (ref 6–23)
CO2: 30 mEq/L (ref 19–32)
Calcium: 10.1 mg/dL (ref 8.4–10.5)
Chloride: 104 mEq/L (ref 96–112)
Creatinine, Ser: 1.01 mg/dL (ref 0.50–1.10)
GFR, EST AFRICAN AMERICAN: 76 mL/min — AB (ref >90)
GFR, EST NON AFRICAN AMERICAN: 65 mL/min — AB (ref >90)
Glucose, Bld: 98 mg/dL (ref 70–99)
POTASSIUM: 4.2 meq/L (ref 3.7–5.3)
SODIUM: 145 meq/L (ref 137–147)

## 2013-05-14 ENCOUNTER — Ambulatory Visit (HOSPITAL_COMMUNITY): Payer: BC Managed Care – PPO | Admitting: Anesthesiology

## 2013-05-14 ENCOUNTER — Encounter (HOSPITAL_COMMUNITY): Payer: Self-pay

## 2013-05-14 ENCOUNTER — Ambulatory Visit (HOSPITAL_COMMUNITY)
Admission: RE | Admit: 2013-05-14 | Discharge: 2013-05-14 | Disposition: A | Discharge status: Home / Self Care | Payer: BC Managed Care – PPO | Source: Ambulatory Visit | Attending: Gastroenterology | Admitting: Gastroenterology

## 2013-05-14 ENCOUNTER — Encounter (HOSPITAL_COMMUNITY): Payer: BC Managed Care – PPO | Admitting: Anesthesiology

## 2013-05-14 ENCOUNTER — Encounter (HOSPITAL_COMMUNITY)
Admission: RE | Disposition: A | Discharge status: Home / Self Care | Payer: Self-pay | Source: Ambulatory Visit | Attending: Gastroenterology

## 2013-05-14 DIAGNOSIS — K5289 Other specified noninfective gastroenteritis and colitis: Secondary | ICD-10-CM | POA: Insufficient documentation

## 2013-05-14 DIAGNOSIS — K633 Ulcer of intestine: Secondary | ICD-10-CM

## 2013-05-14 DIAGNOSIS — K6389 Other specified diseases of intestine: Secondary | ICD-10-CM | POA: Insufficient documentation

## 2013-05-14 DIAGNOSIS — K5000 Crohn's disease of small intestine without complications: Secondary | ICD-10-CM

## 2013-05-14 HISTORY — PX: BIOPSY: SHX5522

## 2013-05-14 HISTORY — PX: COLONOSCOPY WITH PROPOFOL: SHX5780

## 2013-05-14 SURGERY — COLONOSCOPY WITH PROPOFOL
Anesthesia: Monitor Anesthesia Care | Site: Rectum

## 2013-05-14 MED ORDER — FENTANYL CITRATE 0.05 MG/ML IJ SOLN
25.0000 ug | INTRAMUSCULAR | Status: AC
  Administered 2013-05-14: 25 ug via INTRAVENOUS

## 2013-05-14 MED ORDER — ONDANSETRON HCL 4 MG/2ML IJ SOLN
INTRAMUSCULAR | Status: AC
  Filled 2013-05-14: qty 2

## 2013-05-14 MED ORDER — FENTANYL CITRATE 0.05 MG/ML IJ SOLN
INTRAMUSCULAR | Status: AC
  Filled 2013-05-14: qty 2

## 2013-05-14 MED ORDER — PROPOFOL INFUSION 10 MG/ML OPTIME
INTRAVENOUS | Status: DC | PRN
  Administered 2013-05-14: 100 ug/kg/min via INTRAVENOUS
  Administered 2013-05-14: 75 ug/kg/min via INTRAVENOUS

## 2013-05-14 MED ORDER — PROPOFOL 10 MG/ML IV BOLUS
INTRAVENOUS | Status: AC
  Filled 2013-05-14: qty 20

## 2013-05-14 MED ORDER — FENTANYL CITRATE 0.05 MG/ML IJ SOLN
INTRAMUSCULAR | Status: DC | PRN
  Administered 2013-05-14 (×4): 25 ug via INTRAVENOUS

## 2013-05-14 MED ORDER — LIDOCAINE HCL (PF) 1 % IJ SOLN
INTRAMUSCULAR | Status: AC
  Filled 2013-05-14: qty 5

## 2013-05-14 MED ORDER — GLYCOPYRROLATE 0.2 MG/ML IJ SOLN
0.2000 mg | Freq: Once | INTRAMUSCULAR | Status: AC
  Administered 2013-05-14: 0.2 mg via INTRAVENOUS

## 2013-05-14 MED ORDER — MIDAZOLAM HCL 2 MG/2ML IJ SOLN
INTRAMUSCULAR | Status: AC
  Filled 2013-05-14: qty 2

## 2013-05-14 MED ORDER — MIDAZOLAM HCL 5 MG/5ML IJ SOLN
INTRAMUSCULAR | Status: DC | PRN
  Administered 2013-05-14: 2 mg via INTRAVENOUS

## 2013-05-14 MED ORDER — ONDANSETRON HCL 4 MG/2ML IJ SOLN: 4.0000 mg | Freq: Once | INTRAMUSCULAR | Status: DC | PRN

## 2013-05-14 MED ORDER — LACTATED RINGERS IV SOLN
INTRAVENOUS | Status: DC
  Administered 2013-05-14: 10:00:00 via INTRAVENOUS

## 2013-05-14 MED ORDER — FENTANYL CITRATE 0.05 MG/ML IJ SOLN: 25.0000 ug | INTRAMUSCULAR | Status: DC | PRN

## 2013-05-14 MED ORDER — LACTATED RINGERS IV SOLN
INTRAVENOUS | Status: DC | PRN
  Administered 2013-05-14: 09:00:00 via INTRAVENOUS

## 2013-05-14 MED ORDER — MIDAZOLAM HCL 2 MG/2ML IJ SOLN
1.0000 mg | INTRAMUSCULAR | Status: DC | PRN
  Administered 2013-05-14: 2 mg via INTRAVENOUS

## 2013-05-14 MED ORDER — STERILE WATER FOR IRRIGATION IR SOLN
Status: DC | PRN
  Administered 2013-05-14: 12:00:00

## 2013-05-14 MED ORDER — GLYCOPYRROLATE 0.2 MG/ML IJ SOLN
INTRAMUSCULAR | Status: AC
  Filled 2013-05-14: qty 1

## 2013-05-14 MED ORDER — ONDANSETRON HCL 4 MG/2ML IJ SOLN
4.0000 mg | Freq: Once | INTRAMUSCULAR | Status: AC
  Administered 2013-05-14: 4 mg via INTRAVENOUS

## 2013-05-14 SURGICAL SUPPLY — 11 items
ENTRADA COLONIC OVERTUBE ×2 IMPLANT
FLOOR PAD 36X40 (MISCELLANEOUS) ×3
FORCEPS BIOP RAD 4 LRG CAP 4 (CUTTING FORCEPS) ×2 IMPLANT
KIT CLEAN ENDO COMPLIANCE (KITS) ×3 IMPLANT
LUBRICANT JELLY 4.5OZ STERILE (MISCELLANEOUS) ×2 IMPLANT
MANIFOLD NEPTUNE II (INSTRUMENTS) ×2 IMPLANT
PAD FLOOR 36X40 (MISCELLANEOUS) ×1 IMPLANT
SYR 50ML LL SCALE MARK (SYRINGE) ×2 IMPLANT
TUBING ENDO SMARTCAP PENTAX (MISCELLANEOUS) ×2 IMPLANT
TUBING IRRIGATION ENDOGATOR (MISCELLANEOUS) ×2 IMPLANT
WATER STERILE IRR 1000ML POUR (IV SOLUTION) ×2 IMPLANT

## 2013-05-14 NOTE — Anesthesia Preprocedure Evaluation (Deleted)
Anesthesia Evaluation  Patient identified by MRN, date of birth, ID band Patient awake    Reviewed: Allergy & Precautions, H&P , NPO status , Patient's Chart, lab work & pertinent test results  Airway Mallampati: I TM Distance: >3 FB Neck ROM: full    Dental no notable dental hx. (+) Teeth Intact   Pulmonary Current Smoker,  breath sounds clear to auscultation  Pulmonary exam normal       Cardiovascular negative cardio ROS  Rhythm:Regular Rate:Normal     Neuro/Psych PSYCHIATRIC DISORDERS Depression negative neurological ROS     GI/Hepatic Neg liver ROS, GERD-  Medicated and Controlled,  Endo/Other  negative endocrine ROS  Renal/GU negative Renal ROS  negative genitourinary   Musculoskeletal   Abdominal Normal abdominal exam  (+)   Peds  Hematology negative hematology ROS (+)   Anesthesia Other Findings   Reproductive/Obstetrics negative OB ROS                           Anesthesia Physical Anesthesia Plan  ASA: II  Anesthesia Plan: MAC   Post-op Pain Management:    Induction: Intravenous  Airway Management Planned: Simple Face Mask  Additional Equipment:   Intra-op Plan:   Post-operative Plan:   Informed Consent: I have reviewed the patients History and Physical, chart, labs and discussed the procedure including the risks, benefits and alternatives for the proposed anesthesia with the patient or authorized representative who has indicated his/her understanding and acceptance.     Plan Discussed with:   Anesthesia Plan Comments:         Anesthesia Quick Evaluation                                  Anesthesia Evaluation  Patient identified by MRN, date of birth, ID band Patient awake    Reviewed: Allergy & Precautions, H&P , NPO status , Patient's Chart, lab work & pertinent test results  Airway Mallampati: I TM Distance: >3 FB Neck ROM: full    Dental no  notable dental hx. (+) Teeth Intact   Pulmonary Current Smoker,    Pulmonary exam normal       Cardiovascular negative cardio ROS      Neuro/Psych negative neurological ROS     GI/Hepatic Neg liver ROS, GERD-  Medicated and Controlled,  Endo/Other  negative endocrine ROS  Renal/GU negative Renal ROS  negative genitourinary   Musculoskeletal   Abdominal Normal abdominal exam  (+)   Peds  Hematology negative hematology ROS (+)   Anesthesia Other Findings   Reproductive/Obstetrics negative OB ROS                           Anesthesia Physical Anesthesia Plan  ASA: II  Anesthesia Plan: General   Post-op Pain Management:    Induction: Intravenous  Airway Management Planned: Oral ETT  Additional Equipment:   Intra-op Plan:   Post-operative Plan: Extubation in OR  Informed Consent: I have reviewed the patients History and Physical, chart, labs and discussed the procedure including the risks, benefits and alternatives for the proposed anesthesia with the patient or authorized representative who has indicated his/her understanding and acceptance.   Dental Advisory Given  Plan Discussed with: CRNA and Surgeon  Anesthesia Plan Comments:         Anesthesia Quick Evaluation

## 2013-05-14 NOTE — Op Note (Signed)
[facility] [address] [place], [address]   COLONOSCOPY PROCEDURE REPORT  PATIENT: Renee Atkinson, Renee Atkinson  MR#: [id number] BIRTHDATE: 1965/05/06 , 17  yrs. old GENDER: Female ENDOSCOPIST: Barney Drain, MD REFERRED IT:GPQDI Tapper, M.D. PROCEDURE DATE:  05/14/2013 PROCEDURE:   Colonoscopy with biopsy INDICATIONS:PERSISTENT terminal ileitis on ct.  biopsies on TCS NOV 2014 INCONCLUSIVE. MEDICATIONS: MAC sedation, administered by CRNA  DESCRIPTION OF PROCEDURE:    Physical exam was performed.  Informed consent was obtained from the patient after explaining the benefits, risks, and alternatives to procedure.  The patient was connected to monitor and placed in left lateral position. Continuous oxygen was provided by nasal cannula and IV medicine administered through an indwelling cannula.  After administration of sedation and rectal exam, the patients rectum was intubated and the     colonoscope was advanced under direct visualization to the ileum.  The scope was removed slowly by carefully examining the color, texture, anatomy, and integrity mucosa on the way out.  The patient was recovered in endoscopy and discharged home in satisfactory condition.    COLON FINDINGS: PERFORMED WITH ULTRASLIM SCOPE/OVERTUBE. Non-bleeding mucosal ulceration, intermittent across the area examined, was present in the terminal ileum and at the ileocecal valve.  20 CM VISUALIZED.  Multiple biopsies were performed using cold forceps.  , A normal appearing cecum, and appendiceal orifice were identified.  The ascending, hepatic flexure, transverse, splenic flexure, descending, sigmoid colon and rectum appeared unremarkable.  No polyps or cancers were seen.  Multiple biopsies were performed.  , The mucosa appeared normal in the t RECTUM. Multiple biopsies were performed.  PREP QUALITY: excellent.  CECAL W/D TIME: 32 minutes COMPLICATIONS: None  ENDOSCOPIC IMPRESSION: 1.   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   Normal colon 3.   Normal mucosa in RECTUM  RECOMMENDATIONS: PT MAY GO TO THE CROHNS AND COLITIS WEBSITE: [email or web address] FOR MORE INFORMATION. FOLLOW A SOFT MECHANICAL DIET. BIOPSY WILL BE BACK IN 5-7 DAYS. RECOMMEND HUMIRA OR REMICADE TO ACHEIVE REMISSION IF CROHN'S IS CONFIRMED.  FOLLOW UP IN 1 MO.       _______________________________ Lorrin MaisBarney Drain, MD 05/14/2013 2:32 PM

## 2013-05-14 NOTE — Discharge Instructions (Signed)
YOU have ULCERS IN YOUR SMALL BOWEL.     YOU MAY GO TO THE CROHNS AND COLITIS WEBSITE: [email or web address] FOR MORE INFORMATION.  FOLLOW A SOFT MECHANICAL DIET. SEE INFO BELOW.  YOUR BIOPSY WILL BE BACK IN 7 DAYS.  FOLLOW UP IN 1 MO.    Colonoscopy Care After Read the instructions outlined below and refer to this sheet in the next week. These discharge instructions provide you with general information on caring for yourself after you leave the hospital. While your treatment has been planned according to the most current medical practices available, unavoidable complications occasionally occur. If you have any problems or questions after discharge, call DR. Drayke Grabel, [phone].  ACTIVITY  You may resume your regular activity, but move at a slower pace for the next 24 hours.   Take frequent rest periods for the next 24 hours.   Walking will help get rid of the air and reduce the bloated feeling in your belly (abdomen).   No driving for 24 hours (because of the medicine (anesthesia) used during the test).   You may shower.   Do not sign any important legal documents or operate any machinery for 24 hours (because of the anesthesia used during the test).    NUTRITION  Drink plenty of fluids.   You may resume your normal diet as instructed by your doctor.   Begin with a light meal and progress to your normal diet. Heavy or fried foods are harder to digest and may make you feel sick to your stomach (nauseated).   Avoid alcoholic beverages for 24 hours or as instructed.    MEDICATIONS  You may resume your normal medications.   WHAT YOU CAN EXPECT TODAY  Some feelings of bloating in the abdomen.   Passage of more gas than usual.   Spotting of blood in your stool or on the toilet paper  .  IF YOU HAD POLYPS REMOVED DURING THE COLONOSCOPY:  No aspirin products for 7 days or as instructed.   Eat a soft diet IF YOU HAVE NAUSEA, BLOATING, ABDOMINAL PAIN, OR VOMITING.      FINDING OUT THE RESULTS OF YOUR TEST Not all test results are available during your visit. DR. [name] WILL CALL YOU WITHIN 7 DAYS OF YOUR PROCEDUE WITH YOUR RESULTS. Do not assume everything is normal if you have not heard from DR. Skylie Hiott IN ONE WEEK, CALL HER OFFICE AT [phone].  SEEK IMMEDIATE MEDICAL ATTENTION AND CALL THE OFFICE: [phone] IF:  You have more than a spotting of blood in your stool.   Your belly is swollen (abdominal distention).   You are nauseated or vomiting.   You have a temperature over 101F.   You have abdominal pain or discomfort that is severe or gets worse throughout the day.  SOFT MECHANICAL DIET This SOFT MECHANICAL DIET is restricted to:  Foods that are moist, soft-textured, and easy to chew and swallow.   Meats that are ground or are minced no larger than one-quarter inch pieces. Meats are moist with gravy or sauce added.   Foods that do not include bread or bread-like textures except soft pancakes, well-moistened with syrup or sauce.   Textures with some chewing ability required.   Casseroles without rice.   Cooked vegetables that are less than half an inch in size and easily mashed with a fork. No cooked corn, peas, broccoli, cauliflower, cabbage, Brussels sprouts, asparagus, or other fibrous, non-tender or rubbery cooked vegetables.   Canned fruit except for pineapple.  Fruit must be cut into pieces no larger than half an inch in size.   Foods that do not include nuts, seeds, coconut, or sticky textures.   FOOD TEXTURES FOR DYSPHAGIA DIET LEVEL 2 -SOFT MECHANICAL DIET (includes all foods on Dysphagia Diet Level 1 - Pureed, in addition to the foods listed below)  FOOD GROUP: Breads. RECOMMENDED: Soft pancakes, well-moistened with syrup or sauce.  AVOID: All others.  FOOD GROUP: Cereals.  RECOMMENDED: Cooked cereals with little texture, including oatmeal. Unprocessed wheat bran stirred into cereals for bulk. Note: If thin liquids  are restricted, it is important that all of the liquid is absorbed into the cereal.  AVOID: All dry cereals and any cooked cereals that may contain flax seeds or other seeds or nuts. Whole-grain, dry, or coarse cereals. Cereals with nuts, seeds, dried fruit, and/or coconut.  FOOD GROUP: Desserts. RECOMMENDED: Pudding, custard. Soft fruit pies with bottom crust only. Canned fruit (excluding pineapple). Soft, moist cakes with icing.Frozen malts, milk shakes, frozen yogurt, eggnog, nutritional supplements, ice cream, sherbet, regular or sugar-free gelatin, or any foods that become thin liquid at either room (70 F) or body temperature (98 F).  AVOID: Dry, coarse cakes and cookies. Anything with nuts, seeds, coconut, pineapple, or dried fruit. Breakfast yogurt with nuts. Rice or bread pudding.  FOOD GROUP: Fats. RECOMMENDED: Butter, margarine, cream for cereal (depending on liquid consistency recommendations), gravy, cream sauces, sour cream, sour cream dips with soft additives, mayonnaise, salad dressings, cream cheese, cream cheese spreads with soft additives, whipped toppings.  AVOID: All fats with coarse or chunky additives.  FOOD GROUP: Fruits. RECOMMENDED: Soft drained, canned, or cooked fruits without seeds or skin. Fresh soft and ripe banana. Fruit juices with a small amount of pulp. If thin liquids are restricted, fruit juices should be thickened to appropriate consistency.  AVOID: Fresh or frozen fruits. Cooked fruit with skin or seeds. Dried fruits. Fresh, canned, or cooked pineapple.  FOOD GROUP: Meats and Meat Substitutes. (Meat pieces should not exceed 1/4 of an inch cube and should be tender.) RECOMMENDED: Moistened ground or cooked meat, poultry, or fish. Moist ground or tender meat may be served with gravy or sauce. Casseroles without rice. Moist macaroni and cheese, well-cooked pasta with meat sauce, tuna noodle casserole, soft, moist lasagna. Moist meatballs, meatloaf, or fish  loaf. Protein salads, such as tuna or egg without large chunks, celery, or onion. Cottage cheese, smooth quiche without large chunks. Poached, scrambled, or soft-cooked eggs (egg yolks should not be runny but should be moist and able to be mashed with butter, margarine, or other moisture added to them). (Cook eggs to 160 F or use pasteurized eggs for safety.) Souffls may have small, soft chunks. Tofu. Well-cooked, slightly mashed, moist legumes, such as baked beans. All meats or protein substitutes should be served with sauces or moistened to help maintain cohesiveness in the oral cavity.  AVOID: Dry meats, tough meats (such as bacon, sausage, hot dogs, bratwurst). Dry casseroles or casseroles with rice or large chunks. Peanut butter. Cheese slices and cubes. Hard-cooked or crisp fried eggs. Sandwiches.Pizza.  FOOD GROUP: Potatoes and Starches. RECOMMENDED: Well-cooked, moistened, boiled, baked, or mashed potatoes. Well-cooked shredded hash brown potatoes that are not crisp. (All potatoes need to be moist and in sauces.)Well-cooked noodles in sauce. Spaetzel or soft dumplings that have been moistened with butter or gravy.  AVOID: Potato skins and chips. Fried or French-fried potatoes. Rice.  FOOD GROUP: Soups. RECOMMENDED: Soups with easy-to-chew or easy-to-swallow meats or vegetables:  Particle sizes in soups should be less than 1/2 inch. Soups will need to be thickened to appropriate consistency if soup is thinner than prescribed liquid consistency.  AVOID: Soups with large chunks of meat and vegetables. Soups with rice, corn, peas.  FOOD GROUP: Vegetables. RECOMMENDED: All soft, well-cooked vegetables. Vegetables should be less than a half inch. Should be easily mashed with a fork.  AVOID: Cooked corn and peas. Broccoli, cabbage, Brussels sprouts, asparagus, or other fibrous, non-tender or rubbery cooked vegetables.  FOOD GROUP: Miscellaneous. RECOMMENDED: Jams and preserves without seeds,  jelly. Sauces, salsas, etc., that may have small tender chunks less than 1/2 inch. Soft, smooth chocolate bars that are easily chewed.  AVOID: Seeds, nuts, coconut, or sticky foods. Chewy candies such as caramels or licorice.

## 2013-05-14 NOTE — Anesthesia Postprocedure Evaluation (Signed)
  Anesthesia Post-op Note  Patient: Renee Atkinson  Procedure(s) Performed: Procedure(s) with comments: COLONOSCOPY WITH PROPOFOL (N/A) - in cecum @ 1219 ; cecal withdrawa; time- 32 minutes BIOPSY (N/A) - biopsies of the ileum, random colon biopsies & rectal biopsies  Patient Location: PACU  Anesthesia Type:MAC  Level of Consciousness: awake, alert , oriented and patient cooperative  Airway and Oxygen Therapy: Patient Spontanous Breathing  Post-op Pain: none  Post-op Assessment: Post-op Vital signs reviewed, Patient's Cardiovascular Status Stable, Respiratory Function Stable, Patent Airway, No signs of Nausea or vomiting and Pain level controlled  Post-op Vital Signs: Reviewed and stable  Complications: No apparent anesthesia complications

## 2013-05-14 NOTE — Preoperative (Signed)
Beta Blockers   Reason not to administer Beta Blockers:Not Applicable 

## 2013-05-14 NOTE — H&P (Addendum)
  Primary Care Physician:  Deloria Lair, MD Primary Gastroenterologist:  Dr. [name]  Pre-Procedure History & Physical: HPI:  Renee Atkinson is a 48 y.o. female here for ABDOMINAL  PAIN/ileitis.  Past Medical History  Diagnosis Date  . Depression   . GERD (gastroesophageal reflux disease)   . SVD (spontaneous vaginal delivery)     x 2  . Insomnia     Past Surgical History  Procedure Laterality Date  . Cholecystectomy    . Colonoscopy N/A 03/18/2013    Procedure: COLONOSCOPY;  Surgeon: Danie Binder, MD;  Location: [facility];  Service: Endoscopy;  Laterality: N/A;  2:15PM  . Tubal ligation    . Abdominal hysterectomy N/A 04/02/2013    Procedure: HYSTERECTOMY ABDOMINAL;  Surgeon: Sharene Butters, MD;  Location: [facility];  Service: Gynecology;  Laterality: N/A;  . Salpingoophorectomy Bilateral 04/02/2013    Procedure: SALPINGO OOPHORECTOMY;  Surgeon: Sharene Butters, MD;  Location: [facility];  Service: Gynecology;  Laterality: Bilateral;    Prior to Admission medications   Medication Sig Start Date End Date Taking? Authorizing Provider  citalopram (CELEXA) 40 MG tablet Take 40 mg by mouth at bedtime.   Yes Historical Provider, MD  clonazePAM (KLONOPIN) 0.5 MG tablet Take 0.5 mg by mouth at bedtime.   Yes Historical Provider, MD  esomeprazole (NEXIUM) 20 MG capsule Take 20 mg by mouth at bedtime.   Yes Historical Provider, MD    Allergies as of 05/08/2013 - Review Complete 05/08/2013  Allergen Reaction Noted  . Dilaudid [hydromorphone hcl] Other (See Comments) 03/13/2013  . Morphine and related Nausea And Vomiting 02/14/2013    Family History  Problem Relation Age of Onset  . Inflammatory bowel disease Neg Hx   . Colon cancer Neg Hx   . Prostate cancer Father   . Diverticulitis Mother     complicated, colostomy bag temporarily    History   Social History  . Marital Status: Married    Spouse Name: N/A    Number of Children: 2  . Years of Education: N/A    Occupational History  . Not on file.   Social History Main Topics  . Smoking status: Former Smoker -- 0.50 packs/day for 15 years    Types: Cigarettes    Quit date: 05/10/2013  . Smokeless tobacco: Never Used  . Alcohol Use: No  . Drug Use: No  . Sexual Activity: Yes    Birth Control/ Protection: Post-menopausal   Other Topics Concern  . Not on file   Social History Narrative  . No narrative on file    Review of Systems: See HPI, otherwise negative ROS   Physical Exam: There were no vitals taken for this visit. General:   Alert,  pleasant and cooperative in NAD Head:  Normocephalic and atraumatic. Neck:  Supple; Lungs:  Clear throughout to auscultation.    Heart:  Regular rate and rhythm. Abdomen:  Soft, nontender and nondistended. Normal bowel sounds, without guarding, and without rebound.   Neurologic:  Alert and  oriented x4;  grossly normal neurologically.  Impression/Plan:     ABDOMINAL  PAIN/ileitis  PLAN: 1. TCS TODAY-BIOPSY ILEUM & ?COLON

## 2013-05-14 NOTE — Anesthesia Preprocedure Evaluation (Signed)
Anesthesia Evaluation  Patient identified by MRN, date of birth, ID band Patient awake    Reviewed: Allergy & Precautions, H&P , NPO status , Patient's Chart, lab work & pertinent test results  Airway Mallampati: I TM Distance: >3 FB Neck ROM: full    Dental no notable dental hx. (+) Teeth Intact   Pulmonary Current Smoker, former smoker,  breath sounds clear to auscultation  Pulmonary exam normal       Cardiovascular negative cardio ROS  Rhythm:Regular Rate:Normal     Neuro/Psych PSYCHIATRIC DISORDERS Depression negative neurological ROS     GI/Hepatic Neg liver ROS, GERD-  Medicated and Controlled,  Endo/Other  negative endocrine ROS  Renal/GU negative Renal ROS  negative genitourinary   Musculoskeletal   Abdominal Normal abdominal exam  (+)   Peds  Hematology negative hematology ROS (+)   Anesthesia Other Findings   Reproductive/Obstetrics negative OB ROS                           Anesthesia Physical Anesthesia Plan  ASA: II  Anesthesia Plan: MAC   Post-op Pain Management:    Induction: Intravenous  Airway Management Planned: Simple Face Mask  Additional Equipment:   Intra-op Plan:   Post-operative Plan:   Informed Consent: I have reviewed the patients History and Physical, chart, labs and discussed the procedure including the risks, benefits and alternatives for the proposed anesthesia with the patient or authorized representative who has indicated his/her understanding and acceptance.     Plan Discussed with:   Anesthesia Plan Comments:         Anesthesia Quick Evaluation

## 2013-05-14 NOTE — Anesthesia Procedure Notes (Signed)
Procedure Name: MAC Performed by: ADAMS, AMY A Pre-anesthesia Checklist: Patient identified, Timeout performed, Emergency Drugs available, Suction available and Patient being monitored Oxygen Delivery Method: Non-rebreather mask

## 2013-05-14 NOTE — Transfer of Care (Signed)
Immediate Anesthesia Transfer of Care Note  Patient: Renee Atkinson  Procedure(s) Performed: Procedure(s) with comments: COLONOSCOPY WITH PROPOFOL (N/A) - in cecum @ 1219 ; cecal withdrawa; time- 32 minutes BIOPSY (N/A) - biopsies of the ileum, random colon biopsies & rectal biopsies  Patient Location: PACU  Anesthesia Type:MAC  Level of Consciousness: awake, alert , oriented and patient cooperative  Airway & Oxygen Therapy: Patient Spontanous Breathing  Post-op Assessment: Report given to PACU RN and Post -op Vital signs reviewed and stable  Post vital signs: Reviewed and stable  Complications: No apparent anesthesia complications

## 2013-05-15 ENCOUNTER — Encounter (HOSPITAL_COMMUNITY): Payer: Self-pay | Admitting: Gastroenterology

## 2013-05-21 ENCOUNTER — Telehealth: Payer: Self-pay

## 2013-05-21 DIAGNOSIS — K5000 Crohn's disease of small intestine without complications: Secondary | ICD-10-CM

## 2013-05-21 NOTE — Telephone Encounter (Signed)
Pt is calling for the results from her path report to see what medication she needs to be started on. Please advise

## 2013-05-23 NOTE — Telephone Encounter (Signed)
Pt called again for her results. I told her that Dr. [name] has been out due to her baby being very sick. We are expecting her to be at the hospital tomorrow and I will send this note to her. She said that is fine.

## 2013-05-24 MED ORDER — PREDNISONE 10 MG PO TABS: ORAL_TABLET | ORAL | Status: DC

## 2013-05-24 NOTE — Telephone Encounter (Signed)
Called patient TO DISCUSS RESULTS. IF SHE EATS A WHOLE LOT THEN SHE DOES HAVE DIARRHEA SINCE NOV 2014. WORST DAY: 20X/DAY. IF WATCHES WHAT SHE EATS SHE DOES NOT HAVE DIARRHEA. DENIES NAUSEA, VOMITING. GOING ON TRIP Mar 1. PT NEEDS PROMETHEUS IBD DIAGNOSTIC PANEL. DISCUSSED BENEFITS V. RISKS OF STEROIDS, AND EFFECT OF AZATHIOPRINE/BIOLOGICS(HUMIRA/REMICADE).Marland Kitchen PT AGREES TO STEROID TAPER 20 MG QD FOR 14 DAYS THEN 10 MG QD FOR 14 DAYS. IF DATA INCONCLUSIVE, WILL EXTEND LOW DOSE STEROID THROUGH HER TRIP.

## 2013-05-24 NOTE — Telephone Encounter (Addendum)
DISCUSSED BIOPSIES AND CASE WITH DR. Kimball. RECOMMENDED REVIEW PATH (DR.LI OR PATRICK), PROMETHEUS IBD PANEL, AND AZATHIOPRINE V. BIOLOGICS. Will discuss with pt JAN 30.

## 2013-05-27 ENCOUNTER — Other Ambulatory Visit: Payer: Self-pay | Admitting: Gastroenterology

## 2013-05-27 NOTE — Telephone Encounter (Signed)
Pt is aware that Lab order has been faxed to [facility] and she is on her way to have blood drawn.

## 2013-05-28 LAB — PROMETHEUS-MAIL

## 2013-05-29 NOTE — Telephone Encounter (Addendum)
SPOKE TO DR. PATRICK. HE BELIEVES PT HAS CROHN'S BASED ON CLINICAL HISTORY, FINDINGS, AND PATH. HE STATED ONLY 2 THINGS WOULD CAUSE CHRONIC ULCERATED ILEITIS: NSAIDS OR CROHN'S. WILL AWAIT PROMETHEUS PANEL.

## 2013-06-03 ENCOUNTER — Other Ambulatory Visit: Payer: Self-pay | Admitting: Gastroenterology

## 2013-06-03 NOTE — Telephone Encounter (Signed)
New order for [facility] faxed to [facility] and pt is aware to go to lab. She will try to go today.

## 2013-06-03 NOTE — Telephone Encounter (Signed)
REVIEWED.  

## 2013-06-13 NOTE — Telephone Encounter (Signed)
REVIEWED IBD PANEL-ALL MARKERS POS & C/W CROHN'S DISEASE. WILL DISCUSS WITH PT.

## 2013-06-17 ENCOUNTER — Ambulatory Visit: Payer: BC Managed Care – PPO | Admitting: Gastroenterology

## 2013-06-17 ENCOUNTER — Ambulatory Visit (INDEPENDENT_AMBULATORY_CARE_PROVIDER_SITE_OTHER): Payer: BC Managed Care – PPO | Admitting: Gastroenterology

## 2013-06-17 ENCOUNTER — Encounter: Payer: Self-pay | Admitting: Gastroenterology

## 2013-06-17 VITALS — BP 123/84 | HR 104 | Temp 97.8°F | Ht 65.0 in | Wt 141.2 lb

## 2013-06-17 DIAGNOSIS — K5000 Crohn's disease of small intestine without complications: Secondary | ICD-10-CM

## 2013-06-17 NOTE — Assessment & Plan Note (Signed)
48 y/o female initially found to have abnormal appearing terminal ileum back in 01/2013 when she presented with right sided abdominal pain and diarrhea. Findings have been present on three CTs. She had her first colonoscopy in 02/2013. At that time she had ileitis and ulcer at ICV, mild colitis in ascending and sigmoid colon. Path most c/w Crohns vs NSAIDS. Colonoscopy 04/2013 showed abnormal TI but remaining colon normal. Again path could be due to Crohn's or NSAIDs. Recent [facility] IBD panel c/w Crohn's disease.  Currently on low-dose prednisone. Clinically doing very well. We will have to decide long-term therapy. She has plans to go on a cruise outside the country on 06[place]29/13 and does not want to have flare while gone. To discuss next step with Dr. [name] who had plans to discuss findings with therapy with patient prior to this office visit today.

## 2013-06-17 NOTE — Progress Notes (Signed)
Primary Care Physician: Deloria Lair, MD  Primary Gastroenterologist:  Barney Drain, MD   Chief Complaint  Patient presents with  . Follow-up    HPI: Renee Atkinson is a 48 y.o. female here for f/u. She has been released from her gyn s/p hysterectomy for large uterine fibroid. Two ileocolonoscopies since November 2014. Second colonoscopy more consistent with Crohn's disease. Recent [facility] labs also consistent with Crohn's disease. She has been on prednisone, initially 20 mg daily for 2 weeks and currently on 10 mg daily. States she was actually doing very well even prior start the prednisone and is very surprised that she actually has Crohn's disease based on how she's been feeling. She's been doing very well since her hysterectomy. Denies any abdominal pain. BM stools are solid. BM every 2 to 3 days. Avoids raw veggies, tendency to cause diarrhea. No melena, brbpr. No heartburn. No NSAIDs/ASA prior to October.   Scheduled to go on a cruise in March 7.   Current Outpatient Prescriptions  Medication Sig Dispense Refill  . citalopram (CELEXA) 40 MG tablet Take 40 mg by mouth at bedtime.      . clonazePAM (KLONOPIN) 0.5 MG tablet Take 0.5 mg by mouth at bedtime.      Marland Kitchen esomeprazole (NEXIUM) 20 MG capsule Take 20 mg by mouth at bedtime.      . predniSONE (DELTASONE) 10 MG tablet 2 PO QD FOR 14 DAYS THEN  THEN 1 PO QD FOR 14 DAYS  56 tablet  0   No current facility-administered medications for this visit.    Allergies as of 06/17/2013 - Review Complete 06/17/2013  Allergen Reaction Noted  . Dilaudid [hydromorphone hcl] Other (See Comments) 03/13/2013  . Morphine and related Nausea And Vomiting 02/14/2013   Past Surgical History  Procedure Laterality Date  . Cholecystectomy    . Colonoscopy N/A 03/18/2013    Dr. [name]: ileitis, ulcer at ICV, mild colitis in ascending colon and sigmoid colon, moderate sized internal hemorrhoids. bx inconclusive either related to crohns or  NSAIDs  . Tubal ligation    . Abdominal hysterectomy N/A 04/02/2013    Procedure: HYSTERECTOMY ABDOMINAL;  Surgeon: Sharene Butters, MD;  Location: [facility];  Service: Gynecology;  Laterality: N/A;  . Salpingoophorectomy Bilateral 04/02/2013    Procedure: SALPINGO OOPHORECTOMY;  Surgeon: Sharene Butters, MD;  Location: [facility];  Service: Gynecology;  Laterality: Bilateral;  . Colonoscopy with propofol N/A 05/14/2013    SLF:Non-bleeding mucosal ulceration in the terminal ileum and at the ileocecal valve MOST LIKELY DUE TO CROHN'S DISEASE/Normal colon/Normal mucosa in RECTUM. Bx from TI, chronic active ileitis ?crohn's or NSAIDs. random colon and rectum bx negative.  . Esophageal biopsy N/A 05/14/2013    Procedure: BIOPSY;  Surgeon: Danie Binder, MD;  Location: [facility];  Service: Endoscopy;  Laterality: N/A;  biopsies of the ileum, random colon biopsies & rectal biopsies    ROS:  General: Negative for anorexia, weight loss, fever, chills, fatigue, weakness. ENT: Negative for hoarseness, difficulty swallowing , nasal congestion. CV: Negative for chest pain, angina, palpitations, dyspnea on exertion, peripheral edema.  Respiratory: Negative for dyspnea at rest, dyspnea on exertion, cough, sputum, wheezing.  GI: See history of present illness. GU:  Negative for dysuria, hematuria, urinary incontinence, urinary frequency, nocturnal urination.  Endo: Negative for unusual weight change.    Physical Examination:   BP 123/84  Pulse 104  Temp(Src) 97.8 F (36.6 C) (Oral)  Ht 5' 5"  (1.651 m)  Wt 141 lb 3.2 oz (64.048 kg)  BMI 23.50 kg/m2  General: Well-nourished, well-developed in no acute distress.  Eyes: No icterus. Mouth: Oropharyngeal mucosa moist and pink , no lesions erythema or exudate. Lungs: Clear to auscultation bilaterally.  Heart: Regular rate and rhythm, no murmurs rubs or gallops.  Abdomen: Bowel sounds are normal, nontender, nondistended, no hepatosplenomegaly or masses, no  abdominal bruits or hernia , no rebound or guarding.   Extremities: No lower extremity edema. No clubbing or deformities. Neuro: Alert and oriented x 4   Skin: Warm and dry, no jaundice.   Psych: Alert and cooperative, normal mood and affect.

## 2013-06-17 NOTE — Patient Instructions (Signed)
1. Continue prednisone 10 mg daily for now. 2. I will touch base with Dr. [name] today with regards to your Prometheus panel results, how well you are doing, upcoming vacation, et Ronney Asters. We will get back in touch with you within the next 24 hours with further instructions.

## 2013-06-18 ENCOUNTER — Other Ambulatory Visit: Payer: Self-pay

## 2013-06-18 DIAGNOSIS — K5000 Crohn's disease of small intestine without complications: Secondary | ICD-10-CM

## 2013-06-18 NOTE — Addendum Note (Signed)
Addended by: Danie Binder on: 06/18/2013 04:09 PM   Modules accepted: Orders

## 2013-06-18 NOTE — Progress Notes (Signed)
Patient ID: Renee Atkinson, female   DOB: 10/03/1965, 48 y.o.   MRN: [id number] Pt has not has a quantiferon test done. I ordered one but she can not go until Monday to have it done. I have fax the order over to the lab.

## 2013-06-18 NOTE — Progress Notes (Addendum)
REVIEWED. NEEDS Tb assay and Hep B sAg.

## 2013-06-19 LAB — HEPATITIS B SURFACE ANTIGEN: Hepatitis B Surface Ag: NEGATIVE

## 2013-06-19 NOTE — Progress Notes (Signed)
I send the TB test order over Tuesday and just send the other order over to [facility]

## 2013-06-19 NOTE — Progress Notes (Signed)
Will forward to Ginger who has started working on this one.

## 2013-06-19 NOTE — Telephone Encounter (Addendum)
Called patient'S CL/WORK TO DISCUSS RESULTS. VM NOT SETUP. SHE'S NOT WORKING TODAY. [place] PHONE. NO VM SETUP. SEND PT LETTER TO CONTACT OFFICE TO DISCUSS RESULTS.

## 2013-06-19 NOTE — Progress Notes (Signed)
cc'd to pcp 

## 2013-06-19 NOTE — Telephone Encounter (Signed)
Per Darius Bump, pt called and left her cell number for Dr. [name] to return call to : [phone].

## 2013-06-21 LAB — QUANTIFERON TB GOLD ASSAY (BLOOD)
INTERFERON GAMMA RELEASE ASSAY: NEGATIVE
MITOGEN VALUE: 9.45 [IU]/mL
QUANTIFERON NIL VALUE: 0.02 [IU]/mL
QUANTIFERON TB AG MINUS NIL: 0 [IU]/mL
TB Ag value: 0.02 IU/mL

## 2013-06-21 MED ORDER — PREDNISONE 10 MG PO TABS: ORAL_TABLET | ORAL | Status: DC

## 2013-06-21 MED ORDER — CIPROFLOXACIN HCL 500 MG PO TABS: ORAL_TABLET | ORAL | Status: DC

## 2013-06-21 NOTE — Progress Notes (Signed)
Hepatitis B Surface Antigen results are back.

## 2013-06-21 NOTE — Progress Notes (Signed)
SPOKE TO PT FEB 25. WILL AWAIT TB TEST. PLEASE CALL [facility] AND MAKE SURE SHE HAS A HEP B SURFACE ANTIGEN PENIDNG. SHE WILL TAKE A RX FOR CIPRO AND PREDNISONE WITH HER ON THE CRUISE. SHE WILL SEE SHIP DOCTOR FOR BLOODY DIARRHEA. AVOID LOCAL FOOD. USE CIPRO FOR WATERY DIARRHEA AND CALL DR.FILEDS WHEN SHE GET SBACK TO THE [place] IF SHE HAS DIARRHEA OR ABD PAIN.

## 2013-06-21 NOTE — Addendum Note (Signed)
Addended by: Danie Binder on: 06/21/2013 11:32 AM   Modules accepted: Orders

## 2013-06-24 ENCOUNTER — Telehealth: Payer: Self-pay

## 2013-06-24 NOTE — Telephone Encounter (Signed)
Pt called to say she got the prescription for the Prenisone, but not the Cipro. ( she works at [employer]). I told her the order was in and I gave the order to [facility], the pharmacist.  She said that Dr. [name] was going to fax her some info ( She is not sure exactly what) and she can fax it to her at [phone] at [place].   She said she will be leaving Friday, so if Dr. [name] can fax it to her Wed that would be great.

## 2013-06-26 NOTE — Progress Notes (Addendum)
LETTER REGARDING DIETARY AND MED INSTRUCTIONS FAXED TODAY TO [facility] PER PT REQUEST.

## 2013-06-26 NOTE — Telephone Encounter (Addendum)
Called patient TO DISCUSS RESULTS. HER TB AND HEP B TESTIS NEGATIVE. DISCUSSED THERAPY BENEFITS V. RISKS/SIDE EFFECTS OF IMURAN, HUMIRA, OR PREDNISONE INCLUDING INTESTINAL LYMPHOMA, DECREASED RISK TO FIGHT INFECTION, PANCREATIITS, AND PROGRESSIVE MULTIFOCAL LEUKOENCEPHALOPATHY. WILL INITIATE PAPERWORK FOR HUMIRA.

## 2013-07-10 ENCOUNTER — Telehealth: Payer: Self-pay | Admitting: Gastroenterology

## 2013-07-10 NOTE — Telephone Encounter (Signed)
I called pt and told her I will forward the message to Dr. [name] and let her know when I hear back.  She has not heard from the Humira but she knows it takes awhile for PA on meds.

## 2013-07-10 NOTE — Telephone Encounter (Signed)
Pt called to ask SF or PA a question about whether or not she should be taking a vitamin of some sort while she is waiting to get her Humara? Please advise and call her back at [phone]

## 2013-07-11 NOTE — Telephone Encounter (Signed)
PLEASE CALL PT. PAPERWORK FOR HUMIRA HAS BEEN SUBMITTED. SHE MAY TAKE A MVI & A PROBIOTIC DAILY.

## 2013-07-11 NOTE — Telephone Encounter (Signed)
Called and informed pt.  

## 2013-07-23 NOTE — Telephone Encounter (Signed)
PA is done for humira. Received call today from [place]- they have approved humira for 24 months. I called and left message at [facility] and asked them what the "next step"is.

## 2013-07-24 NOTE — Telephone Encounter (Signed)
REVIEWED.  

## 2013-07-24 NOTE — Telephone Encounter (Signed)
PLEASE CALL PT. SHE MAY TAKE A MULTIVITAMIN. SHE SHOULD ALSO ADD CA WITH VIT D BID.

## 2013-07-24 NOTE — Telephone Encounter (Signed)
Renee Atkinson- pharmacist from [facility] called and left voicemail, he stated that since the medication was approved, they are waiting for copay card to be activated and they will mail the rx to the pt. He stated they will contact the patient today. His number is [phone] ext [phone]

## 2013-07-25 NOTE — Telephone Encounter (Signed)
Pt is aware.  

## 2013-07-31 ENCOUNTER — Telehealth: Payer: Self-pay

## 2013-07-31 NOTE — Telephone Encounter (Signed)
Pt called and said she was contacted and her Humira will be here on Friday. 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  She just wanted to know more about the instructions.

## 2013-08-01 NOTE — Telephone Encounter (Signed)
Reminder in [place]

## 2013-08-01 NOTE — Telephone Encounter (Signed)
Called patient TO DISCUSS CONCERNS. EXPLAINED MEDICATION REGIMEN. PT PREFERS 4 INJ DAY 1, 2 INJ D AY 15, AND THEN 1 INJ DAY 29 THEN QOWK. EXPLAINED PT NEED OPV IN 3 MOS E30 CROHN'S ILEITIS. RE-IMAGE ABDOMEN IN 6 MOS. OFFERED APPT WITH DR. Renee Harder FOR SECOND OPINION REGARDING ALTERNATIVES TO A LIFETIME OF HUMIRA.

## 2013-08-21 ENCOUNTER — Telehealth: Payer: Self-pay

## 2013-08-21 MED ORDER — PROMETHAZINE HCL 12.5 MG PO TABS: ORAL_TABLET | ORAL | Status: DC

## 2013-08-21 NOTE — Telephone Encounter (Signed)
Pt is taking the Humair shots and she is nausea from the shots. She took 2 shots on Saturday. She would like to have something called in for nausea to [facility] if possible. Please advsive

## 2013-08-21 NOTE — Telephone Encounter (Signed)
Called and informed pt.  

## 2013-08-21 NOTE — Telephone Encounter (Signed)
PLEASE CALL PT. RX SENT.

## 2013-10-28 ENCOUNTER — Other Ambulatory Visit: Payer: Self-pay

## 2013-10-28 MED ORDER — PROMETHAZINE HCL 12.5 MG PO TABS: ORAL_TABLET | ORAL | Status: DC

## 2013-10-29 ENCOUNTER — Other Ambulatory Visit: Payer: Self-pay | Admitting: *Deleted

## 2013-10-29 ENCOUNTER — Emergency Department (HOSPITAL_COMMUNITY)
Admission: EM | Admit: 2013-10-29 | Discharge: 2013-10-29 | Discharge status: Against Medical Advice | Payer: BC Managed Care – PPO | Attending: Emergency Medicine | Admitting: Emergency Medicine

## 2013-10-29 ENCOUNTER — Encounter (HOSPITAL_COMMUNITY): Payer: Self-pay | Admitting: Emergency Medicine

## 2013-10-29 DIAGNOSIS — Z9889 Other specified postprocedural states: Secondary | ICD-10-CM | POA: Diagnosis not present

## 2013-10-29 DIAGNOSIS — Z79899 Other long term (current) drug therapy: Secondary | ICD-10-CM | POA: Diagnosis not present

## 2013-10-29 DIAGNOSIS — Z9851 Tubal ligation status: Secondary | ICD-10-CM | POA: Insufficient documentation

## 2013-10-29 DIAGNOSIS — Z9089 Acquired absence of other organs: Secondary | ICD-10-CM | POA: Insufficient documentation

## 2013-10-29 DIAGNOSIS — K219 Gastro-esophageal reflux disease without esophagitis: Secondary | ICD-10-CM | POA: Insufficient documentation

## 2013-10-29 DIAGNOSIS — R1110 Vomiting, unspecified: Secondary | ICD-10-CM

## 2013-10-29 DIAGNOSIS — R109 Unspecified abdominal pain: Secondary | ICD-10-CM | POA: Insufficient documentation

## 2013-10-29 DIAGNOSIS — Z87891 Personal history of nicotine dependence: Secondary | ICD-10-CM | POA: Insufficient documentation

## 2013-10-29 DIAGNOSIS — F329 Major depressive disorder, single episode, unspecified: Secondary | ICD-10-CM | POA: Diagnosis not present

## 2013-10-29 DIAGNOSIS — F3289 Other specified depressive episodes: Secondary | ICD-10-CM | POA: Insufficient documentation

## 2013-10-29 DIAGNOSIS — IMO0002 Reserved for concepts with insufficient information to code with codable children: Secondary | ICD-10-CM | POA: Diagnosis not present

## 2013-10-29 DIAGNOSIS — R112 Nausea with vomiting, unspecified: Secondary | ICD-10-CM | POA: Diagnosis not present

## 2013-10-29 DIAGNOSIS — R509 Fever, unspecified: Secondary | ICD-10-CM

## 2013-10-29 LAB — URINALYSIS, ROUTINE W REFLEX MICROSCOPIC
Bilirubin Urine: NEGATIVE
GLUCOSE, UA: NEGATIVE mg/dL
Hgb urine dipstick: NEGATIVE
LEUKOCYTES UA: NEGATIVE
Nitrite: NEGATIVE
Protein, ur: NEGATIVE mg/dL
Specific Gravity, Urine: 1.025 (ref 1.005–1.030)
Urobilinogen, UA: 0.2 mg/dL (ref 0.0–1.0)
pH: 6 (ref 5.0–8.0)

## 2013-10-29 LAB — BASIC METABOLIC PANEL
Anion gap: 13 (ref 5–15)
BUN: 12 mg/dL (ref 6–23)
CO2: 28 mEq/L (ref 19–32)
Calcium: 10.1 mg/dL (ref 8.4–10.5)
Chloride: 98 mEq/L (ref 96–112)
Creatinine, Ser: 0.88 mg/dL (ref 0.50–1.10)
GFR calc Af Amer: 89 mL/min — ABNORMAL LOW (ref >90)
GFR, EST NON AFRICAN AMERICAN: 76 mL/min — AB (ref >90)
Glucose, Bld: 87 mg/dL (ref 70–99)
POTASSIUM: 3.3 meq/L — AB (ref 3.7–5.3)
SODIUM: 139 meq/L (ref 137–147)

## 2013-10-29 LAB — CBC WITH DIFFERENTIAL/PLATELET
BASOS ABS: 0.1 10*3/uL (ref 0.0–0.1)
Basophils Relative: 1 % (ref 0–1)
EOS ABS: 0.1 10*3/uL (ref 0.0–0.7)
EOS PCT: 1 % (ref 0–5)
HCT: 46.3 % — ABNORMAL HIGH (ref 36.0–46.0)
Hemoglobin: 15.5 g/dL — ABNORMAL HIGH (ref 12.0–15.0)
LYMPHS ABS: 4.1 10*3/uL — AB (ref 0.7–4.0)
Lymphocytes Relative: 37 % (ref 12–46)
MCH: 29.6 pg (ref 26.0–34.0)
MCHC: 33.5 g/dL (ref 30.0–36.0)
MCV: 88.4 fL (ref 78.0–100.0)
Monocytes Absolute: 0.7 10*3/uL (ref 0.1–1.0)
Monocytes Relative: 7 % (ref 3–12)
NEUTROS PCT: 54 % (ref 43–77)
Neutro Abs: 6.1 10*3/uL (ref 1.7–7.7)
PLATELETS: 344 10*3/uL (ref 150–400)
RBC: 5.24 MIL/uL — AB (ref 3.87–5.11)
RDW: 14.5 % (ref 11.5–15.5)
WBC: 11.1 10*3/uL — ABNORMAL HIGH (ref 4.0–10.5)

## 2013-10-29 MED ORDER — ONDANSETRON HCL 4 MG/2ML IJ SOLN
4.0000 mg | Freq: Once | INTRAMUSCULAR | Status: AC
  Administered 2013-10-29: 4 mg via INTRAVENOUS
  Filled 2013-10-29: qty 2

## 2013-10-29 MED ORDER — DIPHENHYDRAMINE HCL 50 MG/ML IJ SOLN
25.0000 mg | Freq: Once | INTRAMUSCULAR | Status: DC
  Filled 2013-10-29: qty 1

## 2013-10-29 MED ORDER — KETOROLAC TROMETHAMINE 30 MG/ML IJ SOLN
30.0000 mg | Freq: Once | INTRAMUSCULAR | Status: DC
  Filled 2013-10-29: qty 1

## 2013-10-29 MED ORDER — SODIUM CHLORIDE 0.9 % IV SOLN
1000.0000 mL | Freq: Once | INTRAVENOUS | Status: AC
  Administered 2013-10-29: 1000 mL via INTRAVENOUS

## 2013-10-29 MED ORDER — POTASSIUM CHLORIDE CRYS ER 20 MEQ PO TBCR
40.0000 meq | EXTENDED_RELEASE_TABLET | Freq: Once | ORAL | Status: AC
  Administered 2013-10-29: 40 meq via ORAL
  Filled 2013-10-29: qty 2

## 2013-10-29 MED ORDER — SODIUM CHLORIDE 0.9 % IV BOLUS (SEPSIS)
1000.0000 mL | Freq: Once | INTRAVENOUS | Status: AC
  Administered 2013-10-29: 1000 mL via INTRAVENOUS

## 2013-10-29 MED ORDER — PROMETHAZINE HCL 25 MG/ML IJ SOLN
25.0000 mg | Freq: Once | INTRAMUSCULAR | Status: DC
  Filled 2013-10-29: qty 1

## 2013-10-29 MED ORDER — SODIUM CHLORIDE 0.9 % IV SOLN: 1000.0000 mL | INTRAVENOUS | Status: DC

## 2013-10-29 MED ORDER — METOCLOPRAMIDE HCL 5 MG/ML IJ SOLN
10.0000 mg | Freq: Once | INTRAMUSCULAR | Status: AC
  Administered 2013-10-29: 10 mg via INTRAVENOUS
  Filled 2013-10-29: qty 2

## 2013-10-29 MED ORDER — LORAZEPAM 2 MG/ML IJ SOLN
1.0000 mg | Freq: Once | INTRAMUSCULAR | Status: DC
  Filled 2013-10-29: qty 1

## 2013-10-29 NOTE — ED Provider Notes (Signed)
CSN: [id number]     Arrival date & time 10/29/13  1248 History   This chart was scribed for Janice Norrie, MD by Lowella Petties, ED Scribe. The patient was seen in room APA05/APA05. Patient's care was started at 5:15 PM.      Chief Complaint  Patient presents with  . Emesis    The history is provided by the patient. No language interpreter was used.   HPI Comments: Renee Atkinson is a 48 y.o. female who presents to the Emergency Department complaining of intermittent emesis onset yesterday morning. She reports assoicated fever (max temp 100) and chills. She reports mild right lower back pain. She has had nausea and 3 episodes of vomiting today. She denies dizziness, weakness, diarrhea, abdominal pain, blurred or double vision, numbness or tingling in extremities, dysuria, urinary frequency, and hematuria. She reports that she was seen at an urgent care in [place] earlier today and they found blood in her urine. She reports that she has had similar symptoms when she had a UTI about 6 weeks ago. She reports that she was recently diagnosed with Crohn's disease and that she takes the Humira shot prescribed by Dr. [name] with relief. She reports taking this shot every two weeks now after doing it weekly starting in March. She reports that the Humira shot was started after taking ABX for her Crohn's  and a colon biopsy. She reports having multipule abdominal CT scans and denies a history of kidney stones. She states she took her Humira injection the day before she started feeling bad. She states when she was diagnosed with the UTI 6 weeks ago she had taken her Humira injections shortly before. She has persistent nausea now   She reports that she smokes less than 1 PPD. 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  PCP Dr Scotty Court in [place]  Past Medical History  Diagnosis Date  . Depression   . GERD (gastroesophageal reflux disease)   . SVD (spontaneous vaginal delivery)     x 2  .  Insomnia   . Crohn's disease of ileum OCT 2014   Past Surgical History  Procedure Laterality Date  . Cholecystectomy    . Colonoscopy N/A 03/18/2013    Dr. [name]: ileitis, ulcer at ICV, mild colitis in ascending colon and sigmoid colon, moderate sized internal hemorrhoids. bx inconclusive either related to crohns or NSAIDs  . Tubal ligation    . Abdominal hysterectomy N/A 04/02/2013    Procedure: HYSTERECTOMY ABDOMINAL;  Surgeon: Sharene Butters, MD;  Location: [facility];  Service: Gynecology;  Laterality: N/A;  . Salpingoophorectomy Bilateral 04/02/2013    Procedure: SALPINGO OOPHORECTOMY;  Surgeon: Sharene Butters, MD;  Location: [facility];  Service: Gynecology;  Laterality: Bilateral;  . Colonoscopy with propofol N/A 05/14/2013    SLF:Non-bleeding mucosal ulceration in the terminal ileum and at the ileocecal valve MOST LIKELY DUE TO CROHN'S DISEASE/Normal colon/Normal mucosa in RECTUM. Bx from TI, chronic active ileitis ?crohn's or NSAIDs. random colon and rectum bx negative.  . Esophageal biopsy N/A 05/14/2013    Procedure: BIOPSY;  Surgeon: Danie Binder, MD;  Location: [facility];  Service: Endoscopy;  Laterality: N/A;  biopsies of the ileum, random colon biopsies & rectal biopsies   Family History  Problem Relation Age of Onset  . Inflammatory bowel disease Neg Hx   . Colon cancer Neg Hx   . Prostate cancer Father   . Diverticulitis Mother     complicated, colostomy bag  temporarily   History  Substance Use Topics  . Smoking status: Former Smoker -- 0.50 packs/day for 15 years    Types: Cigarettes    Quit date: 05/10/2013  . Smokeless tobacco: Never Used  . Alcohol Use: No   Employed Still smoking less than 1 ppd  OB History   Grav Para Term Preterm Abortions TAB SAB Ect Mult Living                 Review of Systems  All other systems reviewed and are negative.     Allergies  Dilaudid and Morphine and related  Home Medications   Prior to Admission medications    Medication Sig Start Date End Date Taking? Authorizing Provider  ciprofloxacin (CIPRO) 500 MG tablet 1 po bid for 5 days FOR ABD PAIN AND DIARRHEA. 06/21/13   Danie Binder, MD  citalopram (CELEXA) 40 MG tablet Take 40 mg by mouth at bedtime.    Historical Provider, MD  clonazePAM (KLONOPIN) 0.5 MG tablet Take 0.5 mg by mouth at bedtime.    Historical Provider, MD  esomeprazole (NEXIUM) 20 MG capsule Take 20 mg by mouth at bedtime.    Historical Provider, MD  HUMIRA PEN 40 MG/0.8ML PNKT  10/23/13   Historical Provider, MD  predniSONE (DELTASONE) 10 MG tablet 1 PO QD FOR 14 DAYS THEN  THEN 1/2 PO QD FOR 14 DAYS. ONLY START IF THE SHIP DOCTOR RECOMMENDS IT. 06/21/13   Danie Binder, MD  promethazine (PHENERGAN) 12.5 MG tablet 1-2 po q4-6 h prn nausea or vomiting 10/28/13   Mahala Menghini, PA-C   Triage Vitals:BP 148/92  Pulse 72  Temp(Src) 98.6 F (37 C) (Oral)  Resp 18  Ht 5' 4"  (1.626 m)  Wt 148 lb (67.132 kg)  BMI 25.39 kg/m2  SpO2 100%  Vital signs normal   Physical Exam  Nursing note and vitals reviewed. Constitutional: She is oriented to person, place, and time. She appears well-developed and well-nourished.  Non-toxic appearance. She does not appear ill. No distress.  HENT:  Head: Normocephalic and atraumatic.  Right Ear: External ear normal.  Left Ear: External ear normal.  Nose: Nose normal. No mucosal edema or rhinorrhea.  Mouth/Throat: Oropharynx is clear and moist and mucous membranes are normal. No dental abscesses or uvula swelling.  Eyes: Conjunctivae and EOM are normal. Pupils are equal, round, and reactive to light.  Neck: Normal range of motion and full passive range of motion without pain. Neck supple.  Cardiovascular: Normal rate, regular rhythm and normal heart sounds.  Exam reveals no gallop and no friction rub.   No murmur heard. Pulmonary/Chest: Effort normal and breath sounds normal. No respiratory distress. She has no wheezes. She has no rhonchi. She has no  rales. She exhibits no tenderness and no crepitus.  Abdominal: Soft. Normal appearance and bowel sounds are normal. She exhibits no distension. There is no tenderness. There is no rebound and no guarding.  Genitourinary:  Indicates pain in the right flank but does not have pain to percussion.   Musculoskeletal: Normal range of motion. She exhibits no edema and no tenderness.       Back:  Moves all extremities well.  Area of back pain noted.  Neurological: She is alert and oriented to person, place, and time. She has normal strength. No cranial nerve deficit.  Skin: Skin is warm, dry and intact. No rash noted. No erythema. No pallor.  Psychiatric: She has a normal mood and affect. Her  speech is normal and behavior is normal. Her mood appears not anxious.    ED Course  Procedures (including critical care time) Medications  0.9 %  sodium chloride infusion (0 mLs Intravenous Stopped 10/29/13 1824)    Followed by  0.9 %  sodium chloride infusion (not administered)  diphenhydrAMINE (BENADRYL) injection 25 mg (25 mg Intravenous Not Given 10/29/13 2053)  LORazepam (ATIVAN) injection 1 mg (not administered)  promethazine (PHENERGAN) injection 25 mg (not administered)  ketorolac (TORADOL) 30 MG/ML injection 30 mg (not administered)  ondansetron (ZOFRAN) injection 4 mg (4 mg Intravenous Given 10/29/13 1729)  sodium chloride 0.9 % bolus 1,000 mL (0 mLs Intravenous Stopped 10/29/13 2212)  potassium chloride SA (K-DUR,KLOR-CON) CR tablet 40 mEq (40 mEq Oral Given 10/29/13 1916)  ondansetron (ZOFRAN) injection 4 mg (4 mg Intravenous Given 10/29/13 1832)  metoCLOPramide (REGLAN) injection 10 mg (10 mg Intravenous Given 10/29/13 2052)    DIAGNOSTIC STUDIES: Oxygen Saturation is 100% on room air, normal by my interpretation.    COORDINATION OF CARE: 5:23 PM-Discussed treatment plan with pt at bedside and pt agreed to plan.   I reviewed patient's prior 3 CT scans over the past couple years. All were given with  contrast. There was no obvious renal stones seen on any of the studies. There was no hydronephrosis noted.  1830 this reports patient still complaining of nausea and having dry heaves. She wants a nonsedating nausea medication to be given. Her Zofran was repeated. We went over the side effects of her Humira. A lot of her symptoms are listed as common side effects of the Humira.  After reviewing her laboratory results she was given a second 1 L bolus of normal saline for her dehydration.  8:30 PM -patient continues to state she has nausea and feels bad. The Zofran is not controlling her nausea. Discussed additional nausea medication which could be sedating with pt at bedside and pt agreed to plan.  9:56 PM - Pt feeling anxious after the last medicine and is still vomiting. Will add Phenergan for her continuous vomiting and Ativan for her anxiousness after getting her Reglan.  Nurses report patient started getting a fever. Blood cultures were ordered. Toradol was ordered for her fever and her achiness. However patient refused to get her last medications. She refused to get her blood cultures drawn. Patient states she wanted to leave. Nurses advised that patient sign out AMA since blood cultures were indicated and she really needed the additional medication.   Labs Review Results for orders placed during the hospital encounter of 10/29/13  CBC WITH DIFFERENTIAL      Result Value Ref Range   WBC 11.1 (*) 4.0 - 10.5 K/uL   RBC 5.24 (*) 3.87 - 5.11 MIL/uL   Hemoglobin 15.5 (*) 12.0 - 15.0 g/dL   HCT 46.3 (*) 36.0 - 46.0 %   MCV 88.4  78.0 - 100.0 fL   MCH 29.6  26.0 - 34.0 pg   MCHC 33.5  30.0 - 36.0 g/dL   RDW 14.5  11.5 - 15.5 %   Platelets 344  150 - 400 K/uL   Neutrophils Relative % 54  43 - 77 %   Neutro Abs 6.1  1.7 - 7.7 K/uL   Lymphocytes Relative 37  12 - 46 %   Lymphs Abs 4.1 (*) 0.7 - 4.0 K/uL   Monocytes Relative 7  3 - 12 %   Monocytes Absolute 0.7  0.1 - 1.0 K/uL   Eosinophils  Relative  1  0 - 5 %   Eosinophils Absolute 0.1  0.0 - 0.7 K/uL   Basophils Relative 1  0 - 1 %   Basophils Absolute 0.1  0.0 - 0.1 K/uL  BASIC METABOLIC PANEL      Result Value Ref Range   Sodium 139  137 - 147 mEq/L   Potassium 3.3 (*) 3.7 - 5.3 mEq/L   Chloride 98  96 - 112 mEq/L   CO2 28  19 - 32 mEq/L   Glucose, Bld 87  70 - 99 mg/dL   BUN 12  6 - 23 mg/dL   Creatinine, Ser 0.88  0.50 - 1.10 mg/dL   Calcium 10.1  8.4 - 10.5 mg/dL   GFR calc non Af Amer 76 (*) >90 mL/min   GFR calc Af Amer 89 (*) >90 mL/min   Anion gap 13  5 - 15  URINALYSIS, ROUTINE W REFLEX MICROSCOPIC      Result Value Ref Range   Color, Urine YELLOW  YELLOW   APPearance CLEAR  CLEAR   Specific Gravity, Urine 1.025  1.005 - 1.030   pH 6.0  5.0 - 8.0   Glucose, UA NEGATIVE  NEGATIVE mg/dL   Hgb urine dipstick NEGATIVE  NEGATIVE   Bilirubin Urine NEGATIVE  NEGATIVE   Ketones, ur TRACE (*) NEGATIVE mg/dL   Protein, ur NEGATIVE  NEGATIVE mg/dL   Urobilinogen, UA 0.2  0.0 - 1.0 mg/dL   Nitrite NEGATIVE  NEGATIVE   Leukocytes, UA NEGATIVE  NEGATIVE   Laboratory interpretation all normal except hypokalemia, leukocytosis, concentrated hemoglobin consistent with dehydration.     Imaging Review No results found.   EKG Interpretation None      MDM   Final diagnoses:  Fever, unspecified fever cause  Intractable vomiting with nausea, vomiting of unspecified type    Pt signed out AMA  Rolland Porter, MD, FACEP   I personally performed the services described in this documentation, which was scribed in my presence. The recorded information has been reviewed and considered.  Rolland Porter, MD, FACEP   Janice Norrie, MD 10/30/13 [phone]

## 2013-10-29 NOTE — ED Notes (Signed)
Patient assist to restroom tolerated well.

## 2013-10-29 NOTE — ED Notes (Signed)
Fever, vomiting, headache, Seen at urgent care today and told she had blood in her urine.

## 2013-10-29 NOTE — ED Notes (Signed)
Patient is refusing blood draw and medications at this time patient state "i just want to go" patients husband at bedside. ERMD notified.

## 2013-10-29 NOTE — ED Notes (Signed)
Patient signed out AMA at this time. Patient states understanding of risks and benefits involved with AMA. Patient ambulatory out of department at this time escorted by pts spouse

## 2013-10-30 ENCOUNTER — Telehealth: Payer: Self-pay

## 2013-10-30 NOTE — Telephone Encounter (Signed)
Called and informed the pt.

## 2013-10-30 NOTE — Telephone Encounter (Signed)
PLEASE CALL PT. I REVIEWED HER LABS FORM THE ED. HER COMPLETE BLOOD COUNT SUGGESTS SHE HAD A VIRAL ILLNESS BECAUSE HER LYMPHOCYTES WERE ELEVATED. Marland Kitchen SHE SHOULD ALSO SPEAK WITH THE HUMIRA SUPPORT NURSE AS WELL. CONTINUE TO MONITOR FOR SYMPTOMS AND CALL IF SHE IS NOT FEELING BETTER WHEN HER NEXT DOSE OF HUMIRA IS DUE OR WITH ANOTHER CONCERNS. USE PHENERGAN AS NEEDED FOR NAUSEA. DRINK FLUIDS TO KEEP HER URINE LIGHT YELLOW.

## 2013-10-30 NOTE — Telephone Encounter (Signed)
Pt called and said she has been on Humira since March and not had any problems. Sunday ( July 5th) she took the Humira and then she got deathly sick. She has been to the ED with fever and nausea/vomiting. She said her temperature has stayed at 100.0 and she cannot get it down. The ED seemed to think she was having a reaction to the Humira. She also has a rash on her arm Please advise!

## 2013-11-01 LAB — PROMETHEUS-MAIL

## 2013-11-04 NOTE — Telephone Encounter (Signed)
Pt called and said she just does not want to do the Humira. She has been to the ED twice and Urgent Care twice since her last injection. She has had complaints of rash, chills, aches and vomiting. She was told it was from the shot and she also called the [employer] and was told it is from the shot and not for her.  She said she was doing fine previously by just taking some prednisone and antibiotics when she had a flare and she wonders why she cannot do that again.  Please advise!

## 2013-11-07 MED ORDER — BUDESONIDE 3 MG PO CP24: 9.0000 mg | ORAL_CAPSULE | Freq: Every day | ORAL | Status: DC

## 2013-11-07 MED ORDER — MESALAMINE ER 500 MG PO CPCR: ORAL_CAPSULE | ORAL | Status: DC

## 2013-11-07 NOTE — Telephone Encounter (Signed)
Pt called and said that her 2 medications that Dr. [name] sent in were going to be $190.00 for one and $170.00 for the other. She said that it was no way she could afford it.  Per Almyra Free who does the PA's, she said there is a pt assistance program for the Pentasa but not for the Entocort.  Almyra Free said that Dr. [name] might would consider Uceris in place of the Entocort.  Please advise!

## 2013-11-07 NOTE — Addendum Note (Signed)
Addended by: Danie Binder on: 11/07/2013 02:35 PM   Modules accepted: Orders, Medications

## 2013-11-07 NOTE — Telephone Encounter (Signed)
Pt called today and wanted to know if Dr. [name] had addressed her note about Humira. I explained how busy she has been, but she is in the office today and I will let her know that pt has called again for advice. Please call pt at work today if possible at [phone].

## 2013-11-07 NOTE — Telephone Encounter (Addendum)
Called patient TO DISCUSS COMPLAINT. UNABLE TO TOLERATE HUMIRA. WILL ADD ENTOCORT FOR 8 WEEKS AND CONTINUE PENTASA. PT WANTED A REGIMEN NOT ASSOCIATED WITH SEVERE SIDE EFFECTS. EXPLAINED PENTASA NOT KNOW TO GET HER DISEASE UNDER COMPLETE CONTROL AND IF SHE HAS WORSENING SYMPTOMS THE NEXT STEP WOULD BE IMURAN. CALL HER DRUG CO AND CANCEL HUMIRA SHIPMENTS. FOLLOW UP IN 1 YEAR.  SEP 2015 DX: CROHN'S ILEITIS E30 [facility] ONLY.

## 2013-11-11 NOTE — Telephone Encounter (Signed)
I called and informed pt and faxed the Pentasa Pt Assistance forms to her at the drug store ( [facility]).

## 2013-11-11 NOTE — Telephone Encounter (Signed)
PLEASE CALL PT. SHE CAN USE ENTOCORT FOR ONE WEEK AND APPLY FOR PATIENT ASSISTANCE FOR PENTASA. IF SHE DEVELOPS ABDOMINAL PAIN OR DIARRHEA SHE SHOULD CALL AND I CAN WRITE FOR PREDNISONE.

## 2013-11-11 NOTE — Telephone Encounter (Signed)
Pt called and wanted to know what Dr. [name] has decided about her meds. She did get a week's worth of the Entocort and it cost her $70.00. She started taking it Friday.  Please advise!

## 2013-11-12 NOTE — Telephone Encounter (Signed)
Reminder in epic to follow up in Sept E30 [facility]

## 2013-11-13 NOTE — Telephone Encounter (Signed)
Pt called and said that Dr. [name] has discussed with her before about possibly being referred to a specialist at [facility] and she would like to do so before she spends so much for meds.  She can be reached at work today.  Please advise!

## 2013-11-18 ENCOUNTER — Telehealth: Payer: Self-pay | Admitting: *Deleted

## 2013-11-18 NOTE — Telephone Encounter (Signed)
Pt called wanting to speak with Tamela Oddi. Pt said she would like for Doris to call her back at her work number [phone]

## 2013-11-18 NOTE — Telephone Encounter (Signed)
Pt called back and said please let her know the name of the doctor at [facility] that Dr. [name] was going to refer to. Or can we please get her referred.

## 2013-11-20 ENCOUNTER — Encounter: Payer: Self-pay | Admitting: Gastroenterology

## 2013-11-20 NOTE — Telephone Encounter (Signed)
See addendum to phone note of 10/30/2013.

## 2013-11-25 ENCOUNTER — Telehealth: Payer: Self-pay

## 2013-11-25 NOTE — Telephone Encounter (Signed)
Pt would like to know if you was still going to refer her to someone at [facility]. Please advise.

## 2013-12-02 NOTE — Telephone Encounter (Signed)
Pt called and is concerned that she is having diarrhea. Yesterday over 10 times. Saw a little blood once or twice. She is still having some diarrhea this Am.  Said she called the hospital and was told that Dr. [name] is on call. I told her NO, Dr. Gala Romney is on call at the hospital today, but I will send Dr. [name] a message. She can be reached at [phone] at work today.] Please advise!

## 2013-12-02 NOTE — Telephone Encounter (Signed)
Pt called back. She is very anxious and would like to find out if Dr. [name] has recommendations for her. She has had more diarrhea, even though she is at work, but no blood in stool. Please advise!

## 2013-12-07 NOTE — Telephone Encounter (Addendum)
Called patient TO DISCUSS CONCERNS. DIARRHEA RESOLVED. STILL TAKING ENTOCORT. D NOT COMPLETE PAPERWORK FOR PENTASA. DECLINED REFERRAL TO [facility].

## 2013-12-10 NOTE — Telephone Encounter (Signed)
SEE TC JUL 27

## 2013-12-11 NOTE — Telephone Encounter (Signed)
noted 

## 2013-12-11 NOTE — Telephone Encounter (Signed)
Pt called and wanted to know when she can start Pentasa. She has the paperwork but never did complete it. She said the Entocort is costing her $120.00 monthly and the Pentasa would cost her $180.00 without assistance. She will complete the Pentasa patient assistance forms and bring to [place].  She doesn't think that she will get help, but she will try.

## 2013-12-14 NOTE — Telephone Encounter (Signed)
REVIEWED.  

## 2013-12-17 ENCOUNTER — Telehealth: Payer: Self-pay | Admitting: *Deleted

## 2013-12-17 NOTE — Telephone Encounter (Signed)
I called pt and she said the Nexium is not doing her any good. She has been on it for a year and thinks she needs something else. Please advise!

## 2013-12-17 NOTE — Telephone Encounter (Signed)
Pt called stating she is having heart burn really really really bad, and the nexium is not helping at all. Pt wants something for it ASAP. Please advise [phone]

## 2013-12-18 ENCOUNTER — Telehealth: Payer: Self-pay | Admitting: *Deleted

## 2013-12-18 NOTE — Telephone Encounter (Signed)
Pt called wanting to know if Renee Atkinson got to speak with Dr. [name], I made her aware Renee Atkinson is with a pt and Dr. [name] is here in the office seeing pt's today. Please advise

## 2013-12-19 MED ORDER — PANTOPRAZOLE SODIUM 40 MG PO TBEC: DELAYED_RELEASE_TABLET | ORAL | Status: DC

## 2013-12-19 NOTE — Addendum Note (Signed)
Addended by: Danie Binder on: 12/19/2013 02:54 PM   Modules accepted: Orders, Medications

## 2013-12-19 NOTE — Telephone Encounter (Signed)
Pt is aware and Rx sent to [facility] by fax.

## 2013-12-19 NOTE — Telephone Encounter (Addendum)
PLEASE CALL PT. HER NEXIUM DOSE SHOULD BE 40 MG BID 30 MINS PRIOR TO MEALS. BUT BECAUSE SHE TAKES CELEXA THE DOSE CANNOT BE INCREASED. SH SHOULD TAKE PROTONIX 40 MG 30 MINS PRIOR TO MEALS BID.  IF SHE DOES NOT FEEL SX ARE CONTROLLED AFTER 2 WEEKS THEN SHE SHOULD CALL FOR ANOTHER PPI. RX SENT

## 2013-12-19 NOTE — Telephone Encounter (Signed)
See note 12/17/2013.

## 2013-12-25 ENCOUNTER — Encounter: Payer: Self-pay | Admitting: Gastroenterology

## 2014-01-20 ENCOUNTER — Telehealth: Payer: Self-pay | Admitting: Gastroenterology

## 2014-01-20 NOTE — Telephone Encounter (Signed)
I received a release of records from [facility] today and have forwarded records on this patient.

## 2014-01-20 NOTE — Telephone Encounter (Signed)
REVIEWED. PLEASE NOTE PT BEING SEEN BY DR.BENSON.

## 2014-03-18 ENCOUNTER — Other Ambulatory Visit: Payer: Self-pay | Admitting: Obstetrics and Gynecology

## 2014-03-21 LAB — CYTOLOGY - PAP

## 2015-01-08 ENCOUNTER — Other Ambulatory Visit: Payer: Self-pay

## 2015-01-08 MED ORDER — PANTOPRAZOLE SODIUM 40 MG PO TBEC: DELAYED_RELEASE_TABLET | ORAL | Status: DC

## 2015-12-04 ENCOUNTER — Telehealth: Payer: Self-pay | Admitting: Internal Medicine

## 2015-12-04 NOTE — Telephone Encounter (Signed)
Patient is requesting to see Dr. [name]. She states that she has seen Dr. [name] and [facility] (last seen 05-2013) and says that neither office has been able to diagnose her problems. Patient states that she was told by an ED physician that she has crohn's. Patient states that she will be faxing over GI records from Dr. Wylie Hail office for review.

## 2015-12-15 NOTE — Telephone Encounter (Signed)
Records received and placed on Dr. Vena Rua desk for review.

## 2015-12-17 ENCOUNTER — Encounter: Payer: Self-pay | Admitting: Internal Medicine

## 2015-12-17 NOTE — Telephone Encounter (Signed)
Dr. [name] reviewed records and has accepted patient. Ok to schedule Direct Colon. Colon scheduled.

## 2016-01-14 ENCOUNTER — Encounter: Payer: Self-pay | Admitting: Gastroenterology

## 2016-01-14 ENCOUNTER — Ambulatory Visit (INDEPENDENT_AMBULATORY_CARE_PROVIDER_SITE_OTHER): Payer: BLUE CROSS/BLUE SHIELD | Admitting: Gastroenterology

## 2016-01-14 VITALS — BP 94/68 | HR 88 | Ht 62.75 in | Wt 155.0 lb

## 2016-01-14 DIAGNOSIS — K50018 Crohn's disease of small intestine with other complication: Secondary | ICD-10-CM

## 2016-01-14 DIAGNOSIS — R112 Nausea with vomiting, unspecified: Secondary | ICD-10-CM | POA: Diagnosis not present

## 2016-01-14 DIAGNOSIS — R197 Diarrhea, unspecified: Secondary | ICD-10-CM | POA: Diagnosis not present

## 2016-01-14 MED ORDER — NA SULFATE-K SULFATE-MG SULF 17.5-3.13-1.6 GM/177ML PO SOLN: 1.0000 | Freq: Once | ORAL | 0 refills | Status: AC

## 2016-01-14 NOTE — Patient Instructions (Addendum)
You have been scheduled for an endoscopy and colonoscopy. Please follow the written instructions given to you at your visit today. Please pick up your prep supplies at the pharmacy within the next 1-3 days. If you use inhalers (even only as needed), please bring them with you on the day of your procedure. Your physician has requested that you go to www.startemmi.com and enter the access code given to you at your visit today. This web site gives a general overview about your procedure. However, you should still follow specific instructions given to you by our office regarding your preparation for the procedure.           We sent a prescription for the colonoscopy prep to [facility].    Normal BMI (Body Mass Index- based on height and weight) is between 19 and 25. Your BMI today is Body mass index is 27.68 kg/m. Marland Kitchen Please consider follow up  regarding your BMI with your Primary Care Provider.

## 2016-01-20 ENCOUNTER — Encounter: Payer: Self-pay | Admitting: Gastroenterology

## 2016-01-20 DIAGNOSIS — R197 Diarrhea, unspecified: Secondary | ICD-10-CM | POA: Insufficient documentation

## 2016-01-20 DIAGNOSIS — R112 Nausea with vomiting, unspecified: Secondary | ICD-10-CM | POA: Insufficient documentation

## 2016-01-20 NOTE — Progress Notes (Addendum)
01/14/2016 Renee Atkinson [id number] February 27, 1966   HISTORY OF PRESENT ILLNESS:  This is a 50 year old female who is transferring her GI care to Dr. Hilarie Atkinson. She was previously seen by Dr. Oneida Atkinson at [place] and then was seen by Dr. Britta Atkinson in [place].  Crohn's disease is the issue of question. She was accepted by Dr. Hilarie Atkinson and was scheduled for a colonoscopy for November.  In the interim she was seen in the ER at [facility] for complaints of abdominal pain, nausea, and vomiting. This was on August 7 and at the time she had a CT scan of the abdomen and pelvis with contrast that showed CT scan findings consistent with active Crohn's disease marked wall thickening and inflammation of the distal and terminal ileum for which they could not exclude a small bowel to small bowel fistula. She says that she was seen by Dr. Oneida Atkinson in 2015 at which time she underwent a colonoscopy in January of that year and was found to have normal colonic mucosa but had nonbleeding mucosal ulceration at the terminal ileum at the ileocecal valve most likely due to Crohn's disease. Biopsy showed chronic active ileitis the surface erosion at that site. Biopsies throughout the colon were normal.  Had a colonoscopy in 2014 with similar findings, which is initially when her symptoms began.  Also had [facility] IBD testing that was suggestive of Crohn's disease.  She tells me that she was treated for her symptoms and these findings with Humira, but she could not tolerate that due to flu-like symptoms and only took the medicine for about 6 weeks. Was treated with prednisone and said that her symptoms seemed to get better. Later that year (02/2014) she got another opinion by Dr. Britta Atkinson in [place] and he told her that he did not think she had Crohn's disease. He treated her diarrhea with Questran told her she likely had irritable bowel syndrome.  She is now here for hospital follow-up of her ER visit back in early August.  She tells me  that when she went to the ER in early August she had nausea, vomiting, abdominal pain, diarrhea, and a lot of burning in her esophagus. She said that she had a lot of urgency with an episode of diarrhea with incontinence. She was treated with Flagyl at that ER visit and also received some GI cocktail, which she thinks helped her symptoms the most.   Past Medical History:  Diagnosis Date  . Anxiety   . Crohn's disease of ileum ([place]) [place] 2014  . Depression   . GERD (gastroesophageal reflux disease)   . Insomnia   . SVD (spontaneous vaginal delivery)    x 2   Past Surgical History:  Procedure Laterality Date  . ABDOMINAL HYSTERECTOMY N/A 04/02/2013   Procedure: HYSTERECTOMY ABDOMINAL;  Surgeon: Renee Butters, MD;  Location: [facility];  Service: Gynecology;  Laterality: N/A;  . BIOPSY N/A 05/14/2013   Procedure: BIOPSY;  Surgeon: Renee Binder, MD;  Location: [facility];  Service: Endoscopy;  Laterality: N/A;  biopsies of the ileum, random colon biopsies & rectal biopsies  . CHOLECYSTECTOMY    . COLONOSCOPY N/A 03/18/2013   Dr. Oneida Atkinson: ileitis, ulcer at ICV, mild colitis in ascending colon and sigmoid colon, moderate sized internal hemorrhoids. bx inconclusive either related to crohns or NSAIDs  . COLONOSCOPY WITH PROPOFOL N/A 05/14/2013   SLF:Non-bleeding mucosal ulceration in the terminal ileum and at the ileocecal valve MOST LIKELY DUE TO CROHN'S DISEASE/Normal colon/Normal  mucosa in RECTUM. Bx from TI, chronic active ileitis ?crohn's or NSAIDs. random colon and rectum bx negative.  Renee Atkinson SALPINGOOPHORECTOMY Bilateral 04/02/2013   Procedure: SALPINGO OOPHORECTOMY;  Surgeon: Renee Butters, MD;  Location: [facility];  Service: Gynecology;  Laterality: Bilateral;  . TUBAL LIGATION      reports that she has been smoking Cigarettes.  She has a 7.50 pack-year smoking history. She has never used smokeless tobacco. She reports that she does not drink alcohol or use drugs. family history includes  Diverticulitis in her mother; Irritable bowel syndrome in her mother; Prostate cancer in her father. Allergies  Allergen Reactions  . Dilaudid [Hydromorphone Hcl] Other (See Comments)    Severe headaches  . Humira [Adalimumab]     FEVER, MYALGIAS, NAUSEA, VOMITING  . Morphine And Related Nausea And Vomiting      Outpatient Encounter Prescriptions as of 01/14/2016  Medication Sig  . acetaminophen (TYLENOL) 500 MG tablet Take 500 mg by mouth every 6 (six) hours as needed.  . budesonide (ENTOCORT EC) 3 MG 24 hr capsule Take 3 capsules (9 mg total) by mouth daily. FOR 8 WEEKS  . citalopram (CELEXA) 40 MG tablet Take 40 mg by mouth at bedtime.  . clonazePAM (KLONOPIN) 0.5 MG tablet Take 0.5 mg by mouth at bedtime.  . mesalamine (PENTASA) 500 MG CR capsule 2 PO TID  . pantoprazole (PROTONIX) 40 MG tablet 1 PO 30 MINUTES PRIOR TO MEALS BID FOR 3 MOS THEN QD  . promethazine (PHENERGAN) 12.5 MG tablet Take 12.5-25 mg by mouth every 4 (four) hours as needed for nausea or vomiting.  . [EXPIRED] Na Sulfate-K Sulfate-Mg Sulf 17.5-3.13-1.6 GM/180ML SOLN Take 1 kit by mouth once.   No facility-administered encounter medications on file as of 01/14/2016.      REVIEW OF SYSTEMS  : All other systems reviewed and negative except where noted in the History of Present Illness.   PHYSICAL EXAM: BP 94/68 (BP Location: Left Arm, Patient Position: Sitting, Cuff Size: Normal)   Pulse 88   Ht 5' 2.75" (1.594 m) Comment: height measured without shoes  Wt 155 lb (70.3 kg)   BMI 27.68 kg/m  General: Well developed white female in no acute distress Head: Normocephalic and atraumatic Eyes:  Sclerae anicteric, conjunctiva pink. Ears: Normal auditory acuity Lungs: Clear throughout to auscultation Heart: Regular rate and rhythm Abdomen: Soft, non-distended.  Normal bowel sounds.  Mild diffuse TTP. Rectal:  Will be done at the time of colonoscopy. Musculoskeletal: Symmetrical with no gross deformities  Skin:  No lesions on visible extremities Extremities: No edema  Neurological: Alert oriented x 4, grossly non-focal Psychological:  Alert and cooperative. Normal mood and affect  ASSESSMENT AND PLAN: -50 year old female with what appears to be small bowel Crohn's disease by previous colonoscopies and now recent CT scan even questioning small bowel to small bowel fistula.  She is already scheduled for a colonoscopy, however, this is not until November so we are going to move that up earlier and add an endoscopy as well (now scheduled for 10/16). I had a frank discussion with her that I believe that she doesn't fact have Crohn's disease and that she will likely require some other type of biologic medication to help control this despite her intolerance to Humira.  At this time she is feeling ok clinically so we will await those results and further input from Dr. Hilarie Atkinson.   CC:  Deloria Lair., MD   Addendum: Reviewed and agree with initial management.  Jerene Bears, MD

## 2016-01-29 ENCOUNTER — Encounter: Payer: Self-pay | Admitting: Internal Medicine

## 2016-02-08 ENCOUNTER — Ambulatory Visit (AMBULATORY_SURGERY_CENTER): Payer: BLUE CROSS/BLUE SHIELD | Admitting: Internal Medicine

## 2016-02-08 ENCOUNTER — Encounter: Payer: Self-pay | Admitting: Internal Medicine

## 2016-02-08 VITALS — BP 110/70 | HR 76 | Temp 98.6°F | Resp 20 | Ht 62.75 in | Wt 155.0 lb

## 2016-02-08 DIAGNOSIS — K2970 Gastritis, unspecified, without bleeding: Secondary | ICD-10-CM | POA: Diagnosis not present

## 2016-02-08 DIAGNOSIS — R112 Nausea with vomiting, unspecified: Secondary | ICD-10-CM

## 2016-02-08 DIAGNOSIS — K2950 Unspecified chronic gastritis without bleeding: Secondary | ICD-10-CM | POA: Diagnosis not present

## 2016-02-08 DIAGNOSIS — K50018 Crohn's disease of small intestine with other complication: Secondary | ICD-10-CM

## 2016-02-08 MED ORDER — SODIUM CHLORIDE 0.9 % IV SOLN: 500.0000 mL | INTRAVENOUS | Status: AC

## 2016-02-08 NOTE — Op Note (Signed)
[facility] Patient Name: Renee Atkinson Procedure Date: 02/08/2016 10:00 AM MRN: [id number] Endoscopist: Jerene Bears , MD Age: 50 Referring MD:  Date of Birth: [birth year] Gender: Female Account #: [id number] Procedure:                Upper GI endoscopy Indications:              Abdominal pain, history of Crohn's disease of the                            ileum, Nausea with vomiting Medicines:                Monitored Anesthesia Care Procedure:                Pre-Anesthesia Assessment:                           - Prior to the procedure, a History and Physical                            was performed, and patient medications and                            allergies were reviewed. The patient's tolerance of                            previous anesthesia was also reviewed. The risks                            and benefits of the procedure and the sedation                            options and risks were discussed with the patient.                            All questions were answered, and informed consent                            was obtained. Prior Anticoagulants: The patient has                            taken no previous anticoagulant or antiplatelet                            agents. ASA Grade Assessment: II - A patient with                            mild systemic disease. After reviewing the risks                            and benefits, the patient was deemed in                            satisfactory condition to undergo the procedure.  After obtaining informed consent, the endoscope was                            passed under direct vision. Throughout the                            procedure, the patient's blood pressure, pulse, and                            oxygen saturations were monitored continuously. The                            Model GIF-[id number]) scope was introduced                            through the mouth, and  advanced to the pylorus. The                            upper GI endoscopy was somewhat difficult due to                            narrowing. The patient tolerated the procedure well. Scope In: Scope Out: Findings:                 A widely patent Schatzki ring (acquired) was found                            at the gastroesophageal junction.                           A 2 cm hiatal hernia was present.                           One non-bleeding cratered gastric ulcer with no                            stigmata of bleeding was found at the pylorus. The                            lesion was 6 mm in largest dimension. Biopsies were                            taken with a cold forceps for histology (rule out                            upper Crohn's disease). There is surrounding                            inflammation in the pre-pyloric antrum. This ulcer                            and inflammation causes significant narrowing at  the pyloric channel and the adult upper endoscopy                            was unable to traverse the pylorus.                           The exam of the more proximal stomach was otherwise                            normal.                           An examination of the duodenum was not performed. Complications:            No immediate complications. Estimated Blood Loss:     Estimated blood loss was minimal. Impression:               - Widely patent Schatzki ring.                           - 2 cm hiatal hernia.                           - Gastritis in the pre-pyloric antrum with pyloric                            channel ulcer causing narrowing at the pylorus                            (unable to traverse today). Biopsied. Recommendation:           - Patient has a contact number available for                            emergencies. The signs and symptoms of potential                            delayed complications were discussed with  the                            patient. Return to normal activities tomorrow.                            Written discharge instructions were provided to the                            patient.                           - Soft diet.                           - Continue present medications including twice                            daily Nexium.                           -  Await pathology results.                           - No aspirin, ibuprofen, naproxen, or other                            non-steroidal anti-inflammatory drugs. Jerene Bears, MD 02/08/2016 10:48:49 AM This report has been signed electronically.

## 2016-02-08 NOTE — Op Note (Signed)
[facility] Patient Name: Renee Atkinson Procedure Date: 02/08/2016 9:59 AM MRN: [id number] Endoscopist: Jerene Bears , MD Age: 50 Referring MD:  Date of Birth: 1965/12/14 Gender: Female Account #: [id number] Procedure:                Colonoscopy Indications:              Abdominal pain, Suspected Crohn's disease of the                            small bowel, Follow-up of Crohn's disease of the                            small bowel Medicines:                Monitored Anesthesia Care Procedure:                Pre-Anesthesia Assessment:                           - Prior to the procedure, a History and Physical                            was performed, and patient medications and                            allergies were reviewed. The patient's tolerance of                            previous anesthesia was also reviewed. The risks                            and benefits of the procedure and the sedation                            options and risks were discussed with the patient.                            All questions were answered, and informed consent                            was obtained. Prior Anticoagulants: The patient has                            taken no previous anticoagulant or antiplatelet                            agents. ASA Grade Assessment: II - A patient with                            mild systemic disease. After reviewing the risks                            and benefits, the patient was deemed in  satisfactory condition to undergo the procedure.                           After obtaining informed consent, the colonoscope                            was passed under direct vision. Throughout the                            procedure, the patient's blood pressure, pulse, and                            oxygen saturations were monitored continuously. The                            Model PCF-[id number]) scope was introduced                             through the anus and advanced to the the terminal                            ileum. The colonoscopy was performed without                            difficulty. The patient tolerated the procedure                            well. The quality of the bowel preparation was                            good. The terminal ileum, ileocecal valve,                            appendiceal orifice, and rectum were photographed. Scope In: 10:17:19 AM Scope Out: 10:36:30 AM Scope Withdrawal Time: 0 hours 15 minutes 3 seconds  Total Procedure Duration: 0 hours 19 minutes 11 seconds  Findings:                 The digital rectal exam was normal.                           Inflammation, moderate in severity and                            characterized by erythema, friability, scarring and                            shallow ulcerations was found in the immediate                            terminal ileum at ICV. The terminal ileum was                            visualized, but due to the inflammation and  narrowing at this segment the pediatric colonoscope                            could not be fully introduced into the ileum.                            Biopsies were taken with a cold forceps for                            histology.                           A medium fistula versus diverticulum was found in                            the ascending colon. Biopy was performed. Favor                            fistula over diverticulum given the isolated nature                            of this finding, its appearance, and prior imaging.                           Scattered small-mouthed diverticula were found in                            the sigmoid colon.                           Retroflexion in the rectum was not performed due to                            anatomy.                           Normal mucosa was found in the entire colon. Complications:             No immediate complications. Estimated Blood Loss:     Estimated blood loss was minimal. Impression:               - Crohn's disease with ileitis. Biopsied.                           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                           - Mild diverticulosis in the sigmoid colon.                           - Normal mucosa in the entire examined colon                            without evidence of colitis. Recommendation:           - Patient has a contact number available  for                            emergencies. The signs and symptoms of potential                            delayed complications were discussed with the                            patient. Return to normal activities tomorrow.                            Written discharge instructions were provided to the                            patient.                           - Soft diet and low fiber diet.                           - Continue present medications.                           - Await pathology results.                           - Repeat colonoscopy is recommended. The                            colonoscopy date will be determined after pathology                            results from today's exam become available for                            review.                           - Return to my office at appointment to be                            scheduled. Jerene Bears, MD 02/08/2016 10:56:47 AM This report has been signed electronically.

## 2016-02-08 NOTE — Progress Notes (Signed)
Report to PACU, RN, vss, BBS= Clear.  

## 2016-02-08 NOTE — Patient Instructions (Signed)
Discharge instructions given. Biopsies taken. Handouts on diverticulosis and a hiatal hernia. No aspirin,ibuprofen,naproxen or other non-steroidal anti-inflammatory drugs. Resume previous medications. YOU HAD AN ENDOSCOPIC PROCEDURE TODAY AT [facility]:   Refer to the procedure report that was given to you for any specific questions about what was found during the examination.  If the procedure report does not answer your questions, please call your gastroenterologist to clarify.  If you requested that your care partner not be given the details of your procedure findings, then the procedure report has been included in a sealed envelope for you to review at your convenience later.  YOU SHOULD EXPECT: Some feelings of bloating in the abdomen. Passage of more gas than usual.  Walking can help get rid of the air that was put into your GI tract during the procedure and reduce the bloating. If you had a lower endoscopy (such as a colonoscopy or flexible sigmoidoscopy) you may notice spotting of blood in your stool or on the toilet paper. If you underwent a bowel prep for your procedure, you may not have a normal bowel movement for a few days.  Please Note:  You might notice some irritation and congestion in your nose or some drainage.  This is from the oxygen used during your procedure.  There is no need for concern and it should clear up in a day or so.  SYMPTOMS TO REPORT IMMEDIATELY:   Following lower endoscopy (colonoscopy or flexible sigmoidoscopy):  Excessive amounts of blood in the stool  Significant tenderness or worsening of abdominal pains  Swelling of the abdomen that is new, acute  Fever of 100F or higher   Following upper endoscopy (EGD)  Vomiting of blood or coffee ground material  New chest pain or pain under the shoulder blades  Painful or persistently difficult swallowing  New shortness of breath  Fever of 100F or higher  Black, tarry-looking stools  For  urgent or emergent issues, a gastroenterologist can be reached at any hour by calling [phone].   DIET:  We do recommend a small meal at first, but then you may proceed to your regular diet.  Drink plenty of fluids but you should avoid alcoholic beverages for 24 hours.  ACTIVITY:  You should plan to take it easy for the rest of today and you should NOT DRIVE or use heavy machinery until tomorrow (because of the sedation medicines used during the test).    FOLLOW UP: Our staff will call the number listed on your records the next business day following your procedure to check on you and address any questions or concerns that you may have regarding the information given to you following your procedure. If we do not reach you, we will leave a message.  However, if you are feeling well and you are not experiencing any problems, there is no need to return our call.  We will assume that you have returned to your regular daily activities without incident.  If any biopsies were taken you will be contacted by phone or by letter within the next 1-3 weeks.  Please call us at [phone] if you have not heard about the biopsies in 3 weeks.    SIGNATURES/CONFIDENTIALITY: You and/or your care partner have signed paperwork which will be entered into your electronic medical record.  These signatures attest to the fact that that the information above on your After Visit Summary has been reviewed and is understood.  Full responsibility of the confidentiality  of this discharge information lies with you and/or your care-partner.

## 2016-02-09 ENCOUNTER — Telehealth: Payer: Self-pay | Admitting: *Deleted

## 2016-02-09 NOTE — Telephone Encounter (Signed)
  Follow up Call-  Call back number 02/08/2016  Post procedure Call Back phone  # [phone]  Permission to leave phone message No  Some recent data might be hidden     Patient questions:  Do you have a fever, pain , or abdominal swelling? No. Pain Score  0 *  Have you tolerated food without any problems? Yes.    Have you been able to return to your normal activities? Yes.    Do you have any questions about your discharge instructions: Diet   No. Medications  No. Follow up visit  No.  Do you have questions or concerns about your Care? No.  Actions: * If pain score is 4 or above: No action needed, pain <4.

## 2016-02-19 ENCOUNTER — Telehealth: Payer: Self-pay | Admitting: Internal Medicine

## 2016-02-19 NOTE — Telephone Encounter (Signed)
Pt called wanting her path results. She has follow-up appointment in December but wanted to know if she needs to be on any medication prior to appt. Please advise.

## 2016-02-22 NOTE — Telephone Encounter (Signed)
See result note from EGD and colonoscopy pathology

## 2016-02-23 ENCOUNTER — Other Ambulatory Visit: Payer: Self-pay

## 2016-02-23 DIAGNOSIS — K50919 Crohn's disease, unspecified, with unspecified complications: Secondary | ICD-10-CM

## 2016-02-23 NOTE — Telephone Encounter (Signed)
See result note.  

## 2016-02-24 ENCOUNTER — Telehealth: Payer: Self-pay | Admitting: Internal Medicine

## 2016-02-24 NOTE — Telephone Encounter (Signed)
Need to await pending lab results before Remicade can be started

## 2016-02-24 NOTE — Telephone Encounter (Signed)
Spoke with pt and she is aware.

## 2016-02-24 NOTE — Telephone Encounter (Signed)
Pt states she has decided to do Remicade.

## 2016-02-29 ENCOUNTER — Telehealth: Payer: Self-pay | Admitting: Internal Medicine

## 2016-02-29 NOTE — Telephone Encounter (Signed)
Left message on machine to call back  

## 2016-02-29 NOTE — Telephone Encounter (Signed)
She needs to come for the requested labs Would continue Nexium 40 mg twice daily Begin prednisone 40 mg daily 7 days decreasing by 5 mg every 7 days until off for flare of Crohn's disease Awaiting Remicade initiation Zofran to be offered 4 mg every 6 hours as needed for nausea and vomiting Focus on hydration with very soft diet APP follow-up in one to 2 weeks If severely worsening may need ER and hospital management of active Crohn's disease Send stool for C. difficile

## 2016-02-29 NOTE — Telephone Encounter (Signed)
Pt has a history of crohns and now has had Diarrhea x 3 days 4-5 time daily (while 3 taking imodium), vomiting, abd cramping.  Pt complains of right side abd pain.  No fever.  Has tried gaviscon, only minimal relief.  Lots of bloating. Awaiting lab results prior to starting remicade.  Please advise

## 2016-03-01 ENCOUNTER — Other Ambulatory Visit: Payer: Self-pay

## 2016-03-01 ENCOUNTER — Encounter: Payer: Self-pay | Admitting: Internal Medicine

## 2016-03-01 DIAGNOSIS — R197 Diarrhea, unspecified: Secondary | ICD-10-CM

## 2016-03-01 MED ORDER — PREDNISONE 10 MG PO TABS: ORAL_TABLET | ORAL | 0 refills | Status: DC

## 2016-03-01 NOTE — Telephone Encounter (Signed)
Spoke with pt and she is aware. Pt will come for labs, and states she will call back to schedule appt when she can talk with her husband. Script for prednisone sent to pharmacy. Pt states she has zofran and nexium.

## 2016-03-04 ENCOUNTER — Other Ambulatory Visit: Payer: BLUE CROSS/BLUE SHIELD

## 2016-03-04 ENCOUNTER — Other Ambulatory Visit (INDEPENDENT_AMBULATORY_CARE_PROVIDER_SITE_OTHER): Payer: BLUE CROSS/BLUE SHIELD

## 2016-03-04 DIAGNOSIS — R197 Diarrhea, unspecified: Secondary | ICD-10-CM

## 2016-03-04 DIAGNOSIS — K50919 Crohn's disease, unspecified, with unspecified complications: Secondary | ICD-10-CM

## 2016-03-04 LAB — CBC WITH DIFFERENTIAL/PLATELET
BASOS PCT: 0.6 % (ref 0.0–3.0)
Basophils Absolute: 0.1 10*3/uL (ref 0.0–0.1)
EOS PCT: 1.5 % (ref 0.0–5.0)
Eosinophils Absolute: 0.2 10*3/uL (ref 0.0–0.7)
HEMATOCRIT: 39.5 % (ref 36.0–46.0)
Hemoglobin: 12.9 g/dL (ref 12.0–15.0)
LYMPHS ABS: 2.9 10*3/uL (ref 0.7–4.0)
Lymphocytes Relative: 29.5 % (ref 12.0–46.0)
MCHC: 32.8 g/dL (ref 30.0–36.0)
MCV: 89.4 fl (ref 78.0–100.0)
MONOS PCT: 5.3 % (ref 3.0–12.0)
Monocytes Absolute: 0.5 10*3/uL (ref 0.1–1.0)
NEUTROS ABS: 6.3 10*3/uL (ref 1.4–7.7)
NEUTROS PCT: 63.1 % (ref 43.0–77.0)
PLATELETS: 250 10*3/uL (ref 150.0–400.0)
RBC: 4.41 Mil/uL (ref 3.87–5.11)
RDW: 15.8 % — ABNORMAL HIGH (ref 11.5–15.5)
WBC: 10 10*3/uL (ref 4.0–10.5)

## 2016-03-04 LAB — COMPREHENSIVE METABOLIC PANEL
ALK PHOS: 52 U/L (ref 39–117)
ALT: 19 U/L (ref 0–35)
AST: 15 U/L (ref 0–37)
Albumin: 3.5 g/dL (ref 3.5–5.2)
BUN: 15 mg/dL (ref 6–23)
CALCIUM: 9.4 mg/dL (ref 8.4–10.5)
CO2: 31 meq/L (ref 19–32)
Chloride: 106 mEq/L (ref 96–112)
Creatinine, Ser: 0.97 mg/dL (ref 0.40–1.20)
GFR: 64.5 mL/min (ref >60.00)
GLUCOSE: 84 mg/dL (ref 70–99)
POTASSIUM: 3.6 meq/L (ref 3.5–5.1)
Sodium: 143 mEq/L (ref 135–145)
Total Bilirubin: 0.4 mg/dL (ref 0.2–1.2)
Total Protein: 6.2 g/dL (ref 6.0–8.3)

## 2016-03-04 LAB — HIGH SENSITIVITY CRP: CRP, High Sensitivity: 1.39 mg/L (ref 0.000–5.000)

## 2016-03-05 LAB — HEPATITIS B SURFACE ANTIGEN: HEP B S AG: NEGATIVE

## 2016-03-05 LAB — HEPATITIS B CORE ANTIBODY, TOTAL: HEP B C TOTAL AB: NONREACTIVE

## 2016-03-05 LAB — HEPATITIS B SURFACE ANTIBODY,QUALITATIVE: Hep B S Ab: NEGATIVE

## 2016-03-05 LAB — HEPATITIS C ANTIBODY: HCV AB: NEGATIVE

## 2016-03-05 LAB — CLOSTRIDIUM DIFFICILE BY PCR: Toxigenic C. Difficile by PCR: NOT DETECTED

## 2016-03-08 ENCOUNTER — Other Ambulatory Visit: Payer: Self-pay

## 2016-03-08 DIAGNOSIS — K5090 Crohn's disease, unspecified, without complications: Secondary | ICD-10-CM

## 2016-03-08 LAB — QUANTIFERON TB GOLD ASSAY (BLOOD)

## 2016-03-09 ENCOUNTER — Encounter: Payer: Self-pay | Admitting: *Deleted

## 2016-03-11 ENCOUNTER — Telehealth: Payer: Self-pay | Admitting: Internal Medicine

## 2016-03-11 NOTE — Telephone Encounter (Signed)
Pt was advised that she will be getting a pneumonia vaccine, twin rix and lab test for TB.  She was given the appt date and time for next week.  All questions answered.  She will call back with any further concerns

## 2016-03-11 NOTE — Telephone Encounter (Signed)
Left message on machine to call back  

## 2016-03-15 ENCOUNTER — Encounter: Payer: Self-pay | Admitting: Gastroenterology

## 2016-03-15 ENCOUNTER — Other Ambulatory Visit: Payer: Self-pay

## 2016-03-15 ENCOUNTER — Ambulatory Visit (INDEPENDENT_AMBULATORY_CARE_PROVIDER_SITE_OTHER): Payer: BLUE CROSS/BLUE SHIELD | Admitting: Gastroenterology

## 2016-03-15 ENCOUNTER — Other Ambulatory Visit: Payer: BLUE CROSS/BLUE SHIELD

## 2016-03-15 VITALS — BP 108/72 | HR 76 | Wt 147.4 lb

## 2016-03-15 DIAGNOSIS — K50919 Crohn's disease, unspecified, with unspecified complications: Secondary | ICD-10-CM

## 2016-03-15 DIAGNOSIS — K259 Gastric ulcer, unspecified as acute or chronic, without hemorrhage or perforation: Secondary | ICD-10-CM | POA: Diagnosis not present

## 2016-03-15 DIAGNOSIS — Z23 Encounter for immunization: Secondary | ICD-10-CM | POA: Diagnosis not present

## 2016-03-15 DIAGNOSIS — K5090 Crohn's disease, unspecified, without complications: Secondary | ICD-10-CM

## 2016-03-15 NOTE — Patient Instructions (Addendum)
If you are age 50 or older, your body mass index should be between 23-30. Your Body mass index is 26.31 kg/m. If this is out of the aforementioned range listed, please consider follow up with your Primary Care Provider.  If you are age 47 or younger, your body mass index should be between 19-25. Your Body mass index is 26.31 kg/m. If this is out of the aformentioned range listed, please consider follow up with your Primary Care Provider.   You have been scheduled for an endoscopy. Please follow written instructions given to you at your visit today. If you use inhalers (even only as needed), please bring them with you on the day of your procedure. Your physician has requested that you go to www.startemmi.com and enter the access code given to you at your visit today. This web site gives a general overview about your procedure. However, you should still follow specific instructions given to you by our office regarding your preparation for the procedure.  You have been given your 1st dose of Hepatitis A/B injection. Your next injection is due in 30 days. This appointment is scheduled: 04/14/16. If this appointment is not acceptable, please call [phone] to reschedule.  You have been given Pneumovax today.  Your physician has requested that you go to the basement for the following lab work before leaving today:  TB Quant Gold.

## 2016-03-15 NOTE — Progress Notes (Addendum)
03/15/2016 Renee Atkinson [id number] Dec 21, 1965   History of Present Illness:  This is a 50 year old female who is here for follow-up of Crohn's disease. Underwent colonoscopy and EGD by Dr. Elmo Putt on October 16. Colonoscopy revealed the following:  Inflammation, moderate in severity and characterized by erythema, friability, scarring and shallow ulcerations was found in the immediate terminal ileum at ICV. The terminal ileum was visualized, but due to the inflammation and narrowing at this segment the pediatric colonoscope could not be fully introduced into the ileum. Biopsies were taken with a cold forceps for histology. 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 Biopy was performed. Favor fistula over diverticulum given the isolated nature of this finding, its appearance, and prior imaging. - Scattered small-mouthed diverticula were found in the sigmoid colon. - Retroflexion in the rectum was not performed due to anatomy. - Normal mucosa was found in the entire colon.  Biopsies confirm Crohn's disease with active ileitis.  EGD revealed the following:  - A widely patent Schatzki ring (acquired) was found at the gastroesophageal junction. - A 2 cm hiatal hernia was present. - One non-bleeding cratered gastric ulcer with no stigmata of bleeding was found at the pylorus. The lesion was 6 mm in largest dimension. Biopsies were taken with a cold forceps for histology (rule out upper Crohn's disease). There is surrounding inflammation in the prepyloric antrum. This ulcer and inflammation causes significant narrowing at the pyloric channel and the adult upper endoscopy was unable to traverse the pylorus. - The exam of the more proximal stomach was otherwise normal. - An examination of the duodenum was not performed.  Biopsies showed benign mucosa with chronic active inflammation, negative for Hpylori.  The plan is to proceed with Remicade treatment. That is  in process and today she actually needs to receive her first Twinrix vaccination and her Pneumovax.  Also needs Quantiferon Gold drawn.  She has been on prednisone for about the past week, started at 40 mg and just began to decrease at 35 mg. Says that she is feeling well. Denies any diarrhea, rectal bleeding, significant pain.    Current Medications, Allergies, Past Medical History, Past Surgical History, Family History and Social History were reviewed in [employer] record.   Physical Exam: BP 108/72   Pulse 76   Wt 147 lb 6 oz (66.8 kg)   BMI 26.31 kg/m  General: Well developed white female in no acute distress Head: Normocephalic and atraumatic Eyes:  Sclerae anicteric, conjunctiva pink  Ears: Normal auditory acuity Lungs: Clear throughout to auscultation Heart: Regular rate and rhythm Abdomen: Soft, non-distended. Normal bowel sounds.  Non-tender. Musculoskeletal: Symmetrical with no gross deformities  Extremities: No edema  Neurological: Alert oriented x 4, grossly non-focal Psychological:  Alert and cooperative. 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  Also ? Proximal GI/gastric Crohn's (GU seen on EGD but no Hpylori or NSAID use).  The plan is for her to begin Remicade. She will get her first Twinrix vaccination and her Pneumovax today. Also will check her QuantiFERON Gold and pending that result will get this initiated. She is currently on prednisone taper at 35 mg and will continue to taper that by 5 mg weekly. She is feeling much better at this time. Needs repeat EGD in January to reassess for ulcer healing. We will schedule that at this time.  *  The risks, benefits, and alternatives to EGD were discussed with the patient and she consents to proceed.   Addendum: Reviewed and agree with ongoing management. Jerene Bears, MD

## 2016-03-19 LAB — QUANTIFERON TB GOLD ASSAY (BLOOD)
INTERFERON GAMMA RELEASE ASSAY: NEGATIVE
Mitogen-Nil: 7.05 IU/mL
QUANTIFERON TB AG MINUS NIL: 0 [IU]/mL
Quantiferon Nil Value: 0.02 IU/mL

## 2016-04-08 ENCOUNTER — Telehealth: Payer: Self-pay | Admitting: Internal Medicine

## 2016-04-08 MED ORDER — ONDANSETRON HCL 4 MG PO TABS: 4.0000 mg | ORAL_TABLET | Freq: Four times a day (QID) | ORAL | 1 refills | Status: DC | PRN

## 2016-04-08 NOTE — Telephone Encounter (Signed)
Zofran 4 mg every 6 hours as needed for nausea Notify me if this is not helping I see Remicade scheduled to begin in January, can this be initiated sooner given her active IBD?

## 2016-04-08 NOTE — Telephone Encounter (Signed)
Left message for pt that script has been sent to her pharmacy. Pt instructed to call the office if she would like to try and get Remicade scheduled sooner.

## 2016-04-08 NOTE — Telephone Encounter (Signed)
Pt requesting something for nausea be called to pharmacy for her. Please advise.

## 2016-04-15 ENCOUNTER — Ambulatory Visit: Payer: BLUE CROSS/BLUE SHIELD | Admitting: Internal Medicine

## 2016-04-19 ENCOUNTER — Telehealth: Payer: Self-pay | Admitting: Internal Medicine

## 2016-04-19 NOTE — Telephone Encounter (Signed)
Patient reports that she is having constipation.  She is advised to try Miralax 1-3 times a day then titrate for effect.  She will call back for any additional questions or concerns.

## 2016-04-20 ENCOUNTER — Other Ambulatory Visit: Payer: Self-pay

## 2016-04-20 ENCOUNTER — Telehealth: Payer: Self-pay | Admitting: Internal Medicine

## 2016-04-20 DIAGNOSIS — K5000 Crohn's disease of small intestine without complications: Secondary | ICD-10-CM

## 2016-04-20 NOTE — Telephone Encounter (Signed)
Patient's husband provided the number to short stay to reschedule.

## 2016-04-21 ENCOUNTER — Telehealth: Payer: Self-pay | Admitting: Internal Medicine

## 2016-04-21 NOTE — Telephone Encounter (Signed)
Spoke with the patient. She was instructed and consent signed at her [facility]. She is still okay for the EGD. She needed her Twinrix injection #2 rescheduled. She had missed that. She agrees to come in on 04/26/16 at 2:00 pm.

## 2016-04-26 ENCOUNTER — Ambulatory Visit (INDEPENDENT_AMBULATORY_CARE_PROVIDER_SITE_OTHER): Payer: BLUE CROSS/BLUE SHIELD | Admitting: Internal Medicine

## 2016-04-26 DIAGNOSIS — Z23 Encounter for immunization: Secondary | ICD-10-CM | POA: Diagnosis not present

## 2016-04-28 ENCOUNTER — Encounter: Payer: Self-pay | Admitting: Internal Medicine

## 2016-04-28 ENCOUNTER — Encounter: Payer: BLUE CROSS/BLUE SHIELD | Admitting: Internal Medicine

## 2016-04-28 ENCOUNTER — Ambulatory Visit (AMBULATORY_SURGERY_CENTER): Payer: BLUE CROSS/BLUE SHIELD | Admitting: Internal Medicine

## 2016-04-28 VITALS — BP 94/71 | HR 69 | Temp 97.8°F | Resp 20 | Ht 60.0 in | Wt 147.0 lb

## 2016-04-28 DIAGNOSIS — K311 Adult hypertrophic pyloric stenosis: Secondary | ICD-10-CM

## 2016-04-28 DIAGNOSIS — K259 Gastric ulcer, unspecified as acute or chronic, without hemorrhage or perforation: Secondary | ICD-10-CM | POA: Diagnosis present

## 2016-04-28 DIAGNOSIS — K2950 Unspecified chronic gastritis without bleeding: Secondary | ICD-10-CM | POA: Diagnosis not present

## 2016-04-28 MED ORDER — SODIUM CHLORIDE 0.9 % IV SOLN: 500.0000 mL | INTRAVENOUS | Status: AC

## 2016-04-28 NOTE — Progress Notes (Signed)
Called to room to assist during endoscopic procedure.  Patient ID and intended procedure confirmed with present staff. Received instructions for my participation in the procedure from the performing physician.  

## 2016-04-28 NOTE — Op Note (Addendum)
[facility] Patient Name: Renee Atkinson Procedure Date: 04/28/2016 10:22 AM MRN: [id number] Endoscopist: Jerene Bears , MD Age: 51 Referring MD:  Date of Birth: Feb 09, 1966 Gender: Female Account #: [id number] Procedure:                Upper GI endoscopy Indications:              Follow-up of gastric ulcer at pyloric channel                            causing narrowing; history of ileal Crohn's disease                            and possibility of proximal GI Crohn's, last EGD                            Oct 2017; patient reports intermittent epigastric                            abdominal pain; denies nausea and vomiting Medicines:                Monitored Anesthesia Care Procedure:                Pre-Anesthesia Assessment:                           - Prior to the procedure, a History and Physical                            was performed, and patient medications and                            allergies were reviewed. The patient's tolerance of                            previous anesthesia was also reviewed. The risks                            and benefits of the procedure and the sedation                            options and risks were discussed with the patient.                            All questions were answered, and informed consent                            was obtained. Prior Anticoagulants: The patient has                            taken no previous anticoagulant or antiplatelet                            agents. ASA Grade Assessment: II - A patient with  mild systemic disease. After reviewing the risks                            and benefits, the patient was deemed in                            satisfactory condition to undergo the procedure.                           After obtaining informed consent, the endoscope was                            passed under direct vision. Throughout the                            procedure, the  patient's blood pressure, pulse, and                            oxygen saturations were monitored continuously. The                            Model GIF-HQ190 [id number]) scope was introduced                            through the mouth, and advanced to the pylorus. The                            patient tolerated the procedure well. The upper GI                            endoscopy was technically difficult and complex due                            to narrowing. Scope In: Scope Out: Findings:                 Normal mucosa was found in the entire esophagus.                           A widely patent Schatzki ring (acquired) was found                            at the gastroesophageal junction.                           A benign-appearing, intrinsic moderate stenosis was                            found at the pylorus. This was non-traversed. This                            was again biopsied with a cold forceps for                            histology and  evaluation of chronic inflammation.                            The previously seen ulceration in this location was                            not seen.                           An examination of the duodenum was not performed. Complications:            No immediate complications. Estimated Blood Loss:     Estimated blood loss was minimal. Impression:               - Normal mucosa was found in the entire esophagus.                           - Widely patent Schatzki ring.                           - Gastric stenosis was found at the pylorus unable                            to be traversed with the standard adult upper                            endoscope. Biopsied. Recommendation:           - Patient has a contact number available for                            emergencies. The signs and symptoms of potential                            delayed complications were discussed with the                            patient. Return to normal  activities tomorrow.                            Written discharge instructions were provided to the                            patient.                           - Soft diet.                           - Continue present medications including Nexium 40                            mg daily.                           - Await pathology results.                           -  Avoid NSAIDs.                           - Perform UGI series with follow-through to                            evaluate pyloric channel narrowing                           - Remicade initiation scheduled.                           - May need repeat EGD with dilation of the pyloric                            channel with fluorosocopy in the outpatient                            hospital setting.                           - Increase MiraLax to 17 g BID for ongoing                            constipation (no real improvement with once daily                            dosing per patient, Mg Citrate caused diarrhea). Jerene Bears, MD 04/28/2016 11:00:53 AM This report has been signed electronically.

## 2016-04-28 NOTE — Patient Instructions (Signed)
Discharge instructions given. Biopsies taken. Office will schedule UGI series with follow through to evaluate pyloric channel narrowing. Resume previous medications. YOU HAD AN ENDOSCOPIC PROCEDURE TODAY AT [facility]:   Refer to the procedure report that was given to you for any specific questions about what was found during the examination.  If the procedure report does not answer your questions, please call your gastroenterologist to clarify.  If you requested that your care partner not be given the details of your procedure findings, then the procedure report has been included in a sealed envelope for you to review at your convenience later.  YOU SHOULD EXPECT: Some feelings of bloating in the abdomen. Passage of more gas than usual.  Walking can help get rid of the air that was put into your GI tract during the procedure and reduce the bloating. If you had a lower endoscopy (such as a colonoscopy or flexible sigmoidoscopy) you may notice spotting of blood in your stool or on the toilet paper. If you underwent a bowel prep for your procedure, you may not have a normal bowel movement for a few days.  Please Note:  You might notice some irritation and congestion in your nose or some drainage.  This is from the oxygen used during your procedure.  There is no need for concern and it should clear up in a day or so.  SYMPTOMS TO REPORT IMMEDIATELY:    Following upper endoscopy (EGD)  Vomiting of blood or coffee ground material  New chest pain or pain under the shoulder blades  Painful or persistently difficult swallowing  New shortness of breath  Fever of 100F or higher  Black, tarry-looking stools  For urgent or emergent issues, a gastroenterologist can be reached at any hour by calling [phone].   DIET:  We do recommend a small meal at first, but then you may proceed to your regular diet.  Drink plenty of fluids but you should avoid alcoholic beverages for 24  hours.  ACTIVITY:  You should plan to take it easy for the rest of today and you should NOT DRIVE or use heavy machinery until tomorrow (because of the sedation medicines used during the test).    FOLLOW UP: Our staff will call the number listed on your records the next business day following your procedure to check on you and address any questions or concerns that you may have regarding the information given to you following your procedure. If we do not reach you, we will leave a message.  However, if you are feeling well and you are not experiencing any problems, there is no need to return our call.  We will assume that you have returned to your regular daily activities without incident.  If any biopsies were taken you will be contacted by phone or by letter within the next 1-3 weeks.  Please call us at [phone] if you have not heard about the biopsies in 3 weeks.    SIGNATURES/CONFIDENTIALITY: You and/or your care partner have signed paperwork which will be entered into your electronic medical record.  These signatures attest to the fact that that the information above on your After Visit Summary has been reviewed and is understood.  Full responsibility of the confidentiality of this discharge information lies with you and/or your care-partner.

## 2016-04-28 NOTE — Progress Notes (Signed)
Patient instructed to take Miralax 17gr by mouth twice a day.

## 2016-04-28 NOTE — Progress Notes (Signed)
Report given to PACU RN, vss

## 2016-04-29 ENCOUNTER — Telehealth: Payer: Self-pay | Admitting: *Deleted

## 2016-04-29 ENCOUNTER — Telehealth: Payer: Self-pay | Admitting: Internal Medicine

## 2016-04-29 NOTE — Telephone Encounter (Signed)
I have spoken to patient and have advised that per Dr Vena Rua note on recent endoscopy, she was to be on Nexium once daily. She verbalizes understanding.

## 2016-04-29 NOTE — Telephone Encounter (Signed)
  Follow up Call-  Call back number 04/28/2016 02/08/2016  Post procedure Call Back phone  # [phone]  Permission to leave phone message Yes No  Some recent data might be hidden     Patient questions:  Do you have a fever, pain , or abdominal swelling? No. Pain Score  0 *  Have you tolerated food without any problems? Yes.    Have you been able to return to your normal activities? Yes.    Do you have any questions about your discharge instructions: Diet   No. Medications  No. Follow up visit  No.  Do you have questions or concerns about your Care? No.  Actions: * If pain score is 4 or above: No action needed, pain <4.

## 2016-05-03 ENCOUNTER — Encounter (HOSPITAL_COMMUNITY): Payer: Self-pay

## 2016-05-03 ENCOUNTER — Encounter (HOSPITAL_COMMUNITY)
Admission: RE | Admit: 2016-05-03 | Discharge: 2016-05-03 | Disposition: A | Discharge status: Home / Self Care | Payer: BLUE CROSS/BLUE SHIELD | Source: Ambulatory Visit | Attending: Internal Medicine | Admitting: Internal Medicine

## 2016-05-03 DIAGNOSIS — K5000 Crohn's disease of small intestine without complications: Secondary | ICD-10-CM | POA: Diagnosis not present

## 2016-05-03 MED ORDER — LORATADINE 10 MG PO TABS
10.0000 mg | ORAL_TABLET | Freq: Once | ORAL | Status: AC
  Administered 2016-05-03: 10 mg via ORAL
  Filled 2016-05-03: qty 1

## 2016-05-03 MED ORDER — SODIUM CHLORIDE 0.9 % IV SOLN
Freq: Once | INTRAVENOUS | Status: AC
  Administered 2016-05-03: 09:00:00 via INTRAVENOUS

## 2016-05-03 MED ORDER — SODIUM CHLORIDE 0.9 % IV SOLN
5.0000 mg/kg | Freq: Once | INTRAVENOUS | Status: AC
  Administered 2016-05-03: 300 mg via INTRAVENOUS
  Filled 2016-05-03: qty 30

## 2016-05-03 MED ORDER — DIPHENHYDRAMINE HCL 25 MG PO CAPS: 50.0000 mg | ORAL_CAPSULE | Freq: Once | ORAL | Status: DC

## 2016-05-03 MED ORDER — ACETAMINOPHEN 325 MG PO TABS
650.0000 mg | ORAL_TABLET | Freq: Once | ORAL | Status: AC
  Administered 2016-05-03: 650 mg via ORAL
  Filled 2016-05-03: qty 2

## 2016-05-03 NOTE — Progress Notes (Signed)
Tolerated first Remicade infusion without problems.  Pt stated she didin't tolerate Benadryl well. Dr [name] notified and claritin given instead pre infusion.

## 2016-05-03 NOTE — Discharge Instructions (Signed)
Infliximab injection (infusion)  / Remicade What is this medicine? INFLIXIMAB (in [place] i mab) is used to treat Crohn's disease and ulcerative colitis. It is also used to treat ankylosing spondylitis, psoriasis, and some forms of arthritis. This medicine may be used for other purposes; ask your health care provider or pharmacist if you have questions. COMMON BRAND NAME(S): INFLECTRA, Remicade, RENFLEXIS What should I tell my health care provider before I take this medicine? They need to know if you have any of these conditions: -diabetes -exposure to tuberculosis -heart failure -hepatitis or liver disease -immune system problems -infection -lung or breathing disease, like COPD -multiple sclerosis -current or past resident of [place] or [place] -seizure disorder -an unusual or allergic reaction to infliximab, mouse proteins, other medicines, foods, dyes, or preservatives -pregnant or trying to get pregnant -breast-feeding How should I use this medicine? This medicine is for injection into a vein. It is usually given by a health care professional in a hospital or clinic setting. A special MedGuide will be given to you by the pharmacist with each prescription and refill. Be sure to read this information carefully each time. Talk to your pediatrician regarding the use of this medicine in children. Special care may be needed. Overdosage: If you think you have taken too much of this medicine contact a poison control center or emergency room at once. NOTE: This medicine is only for you. Do not share this medicine with others. What if I miss a dose? It is important not to miss your dose. Call your doctor or health care professional if you are unable to keep an appointment. What may interact with this medicine? Do not take this medicine with any of the following medications: -anakinra -rilonacept This medicine may also interact with the following medications: -vaccines This  list may not describe all possible interactions. Give your health care provider a list of all the medicines, herbs, non-prescription drugs, or dietary supplements you use. Also tell them if you smoke, drink alcohol, or use illegal drugs. Some items may interact with your medicine. What should I watch for while using this medicine? Visit your doctor or health care professional for regular checks on your progress. If you get a cold or other infection while receiving this medicine, call your doctor or health care professional. Do not treat yourself. This medicine may decrease your body's ability to fight infections. Before beginning therapy, your doctor may do a test to see if you have been exposed to tuberculosis. This medicine may make the symptoms of heart failure worse in some patients. If you notice symptoms such as increased shortness of breath or swelling of the ankles or legs, contact your health care provider right away. If you are going to have surgery or dental work, tell your health care professional or dentist that you have received this medicine. If you take this medicine for plaque psoriasis, stay out of the sun. If you cannot avoid being in the sun, wear protective clothing and use sunscreen. Do not use sun lamps or tanning beds/booths. What side effects may I notice from receiving this medicine? Side effects that you should report to your doctor or health care professional as soon as possible: -allergic reactions like skin rash, itching or hives, swelling of the face, lips, or tongue -chest pain -fever or chills, usually related to the infusion -muscle or joint pain -red, scaly patches or raised bumps on the skin -signs of infection - fever or chills, cough, sore throat, pain or  difficulty passing urine -swollen lymph nodes in the neck, underarm, or groin areas -unexplained weight loss -unusual bleeding or bruising -unusually weak or tired -yellowing of the eyes or skin Side effects  that usually do not require medical attention (report to your doctor or health care professional if they continue or are bothersome): -headache -heartburn or stomach pain -nausea, vomiting This list may not describe all possible side effects. Call your doctor for medical advice about side effects. You may report side effects to FDA at 1-800-FDA-1088. Where should I keep my medicine? This drug is given in a hospital or clinic and will not be stored at home. NOTE: This sheet is a summary. It may not cover all possible information. If you have questions about this medicine, talk to your doctor, pharmacist, or health care provider.  2017 Elsevier/Gold Standard (2007-11-28 10:26:02)

## 2016-05-04 ENCOUNTER — Other Ambulatory Visit: Payer: Self-pay

## 2016-05-04 ENCOUNTER — Encounter (HOSPITAL_COMMUNITY): Payer: BLUE CROSS/BLUE SHIELD

## 2016-05-04 DIAGNOSIS — K50918 Crohn's disease, unspecified, with other complication: Secondary | ICD-10-CM

## 2016-05-04 MED ORDER — SODIUM CHLORIDE 0.9 % IV SOLN: 5.0000 mg/kg | Freq: Once | INTRAVENOUS | Status: DC

## 2016-05-04 MED ORDER — ACETAMINOPHEN 325 MG PO TABS: 650.0000 mg | ORAL_TABLET | Freq: Once | ORAL | Status: AC

## 2016-05-04 MED ORDER — LORATADINE 10 MG PO TABS: 10.0000 mg | ORAL_TABLET | Freq: Once | ORAL | Status: AC

## 2016-05-06 ENCOUNTER — Other Ambulatory Visit: Payer: Self-pay

## 2016-05-06 ENCOUNTER — Encounter: Payer: Self-pay | Admitting: Internal Medicine

## 2016-05-06 ENCOUNTER — Telehealth: Payer: Self-pay

## 2016-05-06 DIAGNOSIS — K50919 Crohn's disease, unspecified, with unspecified complications: Secondary | ICD-10-CM

## 2016-05-06 NOTE — Telephone Encounter (Signed)
Pt scheduled for UGI with small bowel follow through at [facility] 05/16/16@9 :30am, pt to arrive there at 9:15am. Pt to be NPO after midnight. Pt aware of appt.

## 2016-05-06 NOTE — Telephone Encounter (Signed)
Spoke with pt and questions were answered.

## 2016-05-06 NOTE — Telephone Encounter (Signed)
-----   Message from Jerene Bears, MD sent at 05/06/2016  9:58 AM EST ----- Regarding: Rads Pt needs ugi series ordered as per recent EGD report Thanks JMP

## 2016-05-06 NOTE — Telephone Encounter (Signed)
Patient has additional questions regarding this. Best # [phone]

## 2016-05-16 ENCOUNTER — Ambulatory Visit (HOSPITAL_COMMUNITY)
Admission: RE | Admit: 2016-05-16 | Discharge: 2016-05-16 | Disposition: A | Discharge status: Home / Self Care | Payer: BLUE CROSS/BLUE SHIELD | Source: Ambulatory Visit | Attending: Internal Medicine | Admitting: Internal Medicine

## 2016-05-16 DIAGNOSIS — K311 Adult hypertrophic pyloric stenosis: Secondary | ICD-10-CM | POA: Insufficient documentation

## 2016-05-16 DIAGNOSIS — K50919 Crohn's disease, unspecified, with unspecified complications: Secondary | ICD-10-CM | POA: Diagnosis present

## 2016-05-17 ENCOUNTER — Telehealth: Payer: Self-pay | Admitting: Internal Medicine

## 2016-05-17 NOTE — Telephone Encounter (Signed)
Patient notified of recommendations and she is scheduled for office visit with K. Sheffield, PA on 05/19/16 9:45 am.  Remicade infusion has been tentatively rescheduled for 05/26/16 1:00 at [facility]. Patient understands to call the office after her visit with dermatology to determine next step.

## 2016-05-17 NOTE — Telephone Encounter (Signed)
Rash certainly could be medication induced, prednisone can cause a rash as can Remicade. With only one dose of Remicade, Remicade rashes felt less likely but I recommend that this be evaluated by dermatology performed proceeding with second dose of Remicade induction Okay to delay Remicade induction 7-10 days while having rash evaluated Benadryl can be used if rash is irritating or painful See result note from upper GI series

## 2016-05-17 NOTE — Telephone Encounter (Signed)
Patient reports that she had her first Remicade infusion on 05/04/16.  She developed a red, burning, "itchy" rash on Sunday 05/15/16.  She denies any other new medications, lotions, soap powers etc. She is due for her next Remicade infusion on 05/19/16.  Please advise

## 2016-05-18 ENCOUNTER — Other Ambulatory Visit: Payer: Self-pay

## 2016-05-18 DIAGNOSIS — K50013 Crohn's disease of small intestine with fistula: Secondary | ICD-10-CM

## 2016-05-18 MED ORDER — DIPHENHYDRAMINE HCL 50 MG PO TABS: 50.0000 mg | ORAL_TABLET | Freq: Every day | ORAL | Status: AC

## 2016-05-18 MED ORDER — SODIUM CHLORIDE 0.9 % IV SOLN: 5.0000 mg/kg | Freq: Once | INTRAVENOUS | Status: DC

## 2016-05-18 MED ORDER — ACETAMINOPHEN 325 MG PO TABS: 650.0000 mg | ORAL_TABLET | Freq: Every day | ORAL | Status: AC

## 2016-05-18 MED ORDER — SODIUM CHLORIDE 0.9 % IV SOLN: INTRAVENOUS | Status: AC

## 2016-05-19 ENCOUNTER — Telehealth: Payer: Self-pay

## 2016-05-19 ENCOUNTER — Other Ambulatory Visit: Payer: Self-pay

## 2016-05-19 ENCOUNTER — Encounter (HOSPITAL_COMMUNITY): Admission: RE | Admit: 2016-05-19 | Payer: BLUE CROSS/BLUE SHIELD | Source: Ambulatory Visit

## 2016-05-19 ENCOUNTER — Other Ambulatory Visit: Payer: Self-pay | Admitting: Physician Assistant

## 2016-05-19 DIAGNOSIS — D039 Melanoma in situ, unspecified: Secondary | ICD-10-CM

## 2016-05-19 DIAGNOSIS — K50119 Crohn's disease of large intestine with unspecified complications: Secondary | ICD-10-CM

## 2016-05-19 HISTORY — DX: Melanoma in situ, unspecified: D03.9

## 2016-05-19 NOTE — Telephone Encounter (Signed)
Called to [facility] short stay to let them know her Remicade is on hold currently until she is evaluated by Dermatology for rash.

## 2016-05-19 NOTE — Telephone Encounter (Signed)
-----   Message from Jerene Bears, MD sent at 05/19/2016  8:07 AM EST ----- Regarding: RE: Request for New Remicade Oders Contact: [phone] Remicade on hold until rash is eval'ed by dermatology JMP  ----- Message ----- From: Fransico Setters, NT Sent: 05/19/2016   7:51 AM To: Althia Forts, RN, Tyrone Sage, RN, # Subject: Request for New Remicade Oders                 Good morning my friend. I am requesting orders for Renee Atkinson's next infusion. It states Renee Atkinson is out of office and to refer requests to one of you guys. I would greatly appreciate if you could help me out with these orders please and thank you in advance.  [employer]

## 2016-05-20 ENCOUNTER — Telehealth: Payer: Self-pay | Admitting: Internal Medicine

## 2016-05-20 NOTE — Telephone Encounter (Signed)
I need to see the office visit from the derm appt before making decision on remicade

## 2016-05-20 NOTE — Telephone Encounter (Signed)
Patient notified that we will get back with her next week once we have seen the note from [facility]

## 2016-05-20 NOTE — Telephone Encounter (Signed)
See message.

## 2016-05-25 ENCOUNTER — Telehealth: Payer: Self-pay | Admitting: Internal Medicine

## 2016-05-25 NOTE — Telephone Encounter (Signed)
Dr. [name] please advise, have you seen the derm reports?

## 2016-05-25 NOTE — Telephone Encounter (Signed)
Pt aware and knows to keep scheduled OV.

## 2016-05-25 NOTE — Telephone Encounter (Signed)
Dermatology consultation reviewed Dermatology cannot exclude that rash was Remicade induced. 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

## 2016-05-26 ENCOUNTER — Encounter: Payer: Self-pay | Admitting: Family Medicine

## 2016-05-26 ENCOUNTER — Encounter (HOSPITAL_COMMUNITY): Admission: RE | Admit: 2016-05-26 | Payer: BLUE CROSS/BLUE SHIELD | Source: Ambulatory Visit

## 2016-05-26 ENCOUNTER — Encounter: Payer: Self-pay | Admitting: *Deleted

## 2016-06-03 ENCOUNTER — Encounter: Payer: Self-pay | Admitting: Internal Medicine

## 2016-06-03 ENCOUNTER — Other Ambulatory Visit (INDEPENDENT_AMBULATORY_CARE_PROVIDER_SITE_OTHER): Payer: BLUE CROSS/BLUE SHIELD

## 2016-06-03 ENCOUNTER — Ambulatory Visit (INDEPENDENT_AMBULATORY_CARE_PROVIDER_SITE_OTHER): Payer: BLUE CROSS/BLUE SHIELD | Admitting: Internal Medicine

## 2016-06-03 VITALS — BP 110/80 | HR 64 | Ht 62.75 in | Wt 144.4 lb

## 2016-06-03 DIAGNOSIS — C439 Malignant melanoma of skin, unspecified: Secondary | ICD-10-CM

## 2016-06-03 DIAGNOSIS — K311 Adult hypertrophic pyloric stenosis: Secondary | ICD-10-CM | POA: Diagnosis not present

## 2016-06-03 DIAGNOSIS — K50113 Crohn's disease of large intestine with fistula: Secondary | ICD-10-CM | POA: Diagnosis not present

## 2016-06-03 DIAGNOSIS — K5090 Crohn's disease, unspecified, without complications: Secondary | ICD-10-CM | POA: Diagnosis not present

## 2016-06-03 LAB — CBC WITH DIFFERENTIAL/PLATELET
BASOS PCT: 1.2 % (ref 0.0–3.0)
Basophils Absolute: 0.1 10*3/uL (ref 0.0–0.1)
EOS ABS: 0.1 10*3/uL (ref 0.0–0.7)
Eosinophils Relative: 1.4 % (ref 0.0–5.0)
HCT: 41.6 % (ref 36.0–46.0)
Hemoglobin: 14.1 g/dL (ref 12.0–15.0)
Lymphocytes Relative: 40.7 % (ref 12.0–46.0)
Lymphs Abs: 3.3 10*3/uL (ref 0.7–4.0)
MCHC: 33.9 g/dL (ref 30.0–36.0)
MCV: 89.6 fl (ref 78.0–100.0)
MONO ABS: 0.5 10*3/uL (ref 0.1–1.0)
Monocytes Relative: 6 % (ref 3.0–12.0)
NEUTROS ABS: 4.1 10*3/uL (ref 1.4–7.7)
Neutrophils Relative %: 50.7 % (ref 43.0–77.0)
PLATELETS: 301 10*3/uL (ref 150.0–400.0)
RBC: 4.64 Mil/uL (ref 3.87–5.11)
RDW: 15.6 % — AB (ref 11.5–15.5)
WBC: 8.2 10*3/uL (ref 4.0–10.5)

## 2016-06-03 LAB — COMPREHENSIVE METABOLIC PANEL
ALT: 12 U/L (ref 0–35)
AST: 14 U/L (ref 0–37)
Albumin: 3.8 g/dL (ref 3.5–5.2)
Alkaline Phosphatase: 49 U/L (ref 39–117)
BUN: 18 mg/dL (ref 6–23)
CALCIUM: 9.3 mg/dL (ref 8.4–10.5)
CHLORIDE: 107 meq/L (ref 96–112)
CO2: 30 meq/L (ref 19–32)
CREATININE: 0.85 mg/dL (ref 0.40–1.20)
GFR: 75.04 mL/min (ref >60.00)
Glucose, Bld: 92 mg/dL (ref 70–99)
POTASSIUM: 3.7 meq/L (ref 3.5–5.1)
SODIUM: 139 meq/L (ref 135–145)
Total Bilirubin: 0.4 mg/dL (ref 0.2–1.2)
Total Protein: 6.5 g/dL (ref 6.0–8.3)

## 2016-06-03 LAB — HIGH SENSITIVITY CRP: CRP, High Sensitivity: 0.65 mg/L (ref 0.000–5.000)

## 2016-06-03 MED ORDER — ESOMEPRAZOLE MAGNESIUM 40 MG PO CPDR: 40.0000 mg | DELAYED_RELEASE_CAPSULE | Freq: Every day | ORAL | 3 refills | Status: DC

## 2016-06-03 NOTE — Progress Notes (Signed)
Subjective:    Patient ID: Renee Atkinson, female    DOB: Apr 16, 1966, 51 y.o.   MRN: [id number]  HPI Renee Atkinson is a 51 year old female with a history of ileocolonic Crohn's disease, gastric ulcer with pyloric stricture possibly related to Crohn's disease, recent diagnosis of melanoma who is here for follow-up. She is here today with her husband. Crohn's diagnosis 2015.  She had a flare of her symptoms and transferred care to our practice in September 2017. Her disease has been terminal ileum with concern for fistulous disease to the small bowel and colon.  Upper endoscopy colonoscopy performed in October 2017. Upper endoscopy showed gastric ulcer and prepyloric stomach with a large stricture unable to be traversed. Colonoscopy showed moderate inflammation in the immediate terminal ileum and the ileocecal valve. 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 There was diverticulosis in the sigmoid.  Upper endoscopy was repeated on one for 18 to follow-up gastric ulcer and pyloric channel narrowing. This showed a widely patent Schatzki's ring and a moderate intrinsic stenosis at the pylorus which was unable to be traversed. It was again biopsied. The duodenum was not examined. These biopsies showed reactive gastropathy in the setting of chronic gastritis without H. pylori, metaplasia or dysplasia.  She was treated with prednisone taper late last year for symptomatic Crohn's ileitis.  She had previously tried Humira and completed induction prior to becoming a patient here. This was associated with flulike symptoms, chills, nausea and vomiting.  Initiated Remicade and she had her first dose 10 days later she developed a diffuse burning and itchy rash located on her bilateral arms upper bilateral legs and abdomen. She saw dermatology and they felt this could be Remicade related. The second dose of Remicade was put on hold. A melanoma was found on her abdomen and she is pending larger  resection next Thursday, 06/09/2016. Rash is gone.  She is feeling surprisingly well. She has been off of the prednisone taper entirely. She denies gastric outlet obstructions. No nausea or vomiting. No bloating. No pain with eating. She has been slightly constipated since prednisone and using MiraLAX daily. She denies abdominal pain, blood in her stool and melena.  As well and wonders what needs to be done regarding her Crohn's because "I feel so good".   Review of Systems As per history of present illness, otherwise negative  Current Medications, Allergies, Past Medical History, Past Surgical History, Family History and Social History were reviewed in [employer] record.     Objective:   Physical Exam BP 110/80   Pulse 64   Ht 5' 2.75" (1.594 m)   Wt 144 lb 6.4 oz (65.5 kg)   BMI 25.78 kg/m  Constitutional: Well-developed and well-nourished. No distress. HEENT: Normocephalic and atraumatic.  Conjunctivae are normal.  No scleral icterus. Neck: Neck supple. Trachea midline. Cardiovascular: Normal rate, regular rhythm and intact distal pulses. No M/R/G Pulmonary/chest: Effort normal and breath sounds normal. No wheezing, rales or rhonchi. Abdominal: Soft, Mild right middle and lower quadrant tenderness with deep palpation, nondistended. Bowel sounds active throughout. Extremities: no clubbing, cyanosis, or edema Lymphadenopathy: No cervical adenopathy noted. Neurological: Alert and oriented to person place and time. Skin: Skin is warm and dry. Small healing lesion right lower abdominal wall from previous dermatologic biopsy Psychiatric: Normal mood and affect. Behavior is normal.  CBC    Component Value Date/Time   WBC 10.0 03/04/2016 0738   RBC 4.41 03/04/2016 0738   HGB 12.9  03/04/2016 0738   HCT 39.5 03/04/2016 0738   PLT 250.0 03/04/2016 0738   MCV 89.4 03/04/2016 0738   MCH 29.6 10/29/2013 1700   MCHC 32.8 03/04/2016 0738   RDW 15.8 (H)  03/04/2016 0738   LYMPHSABS 2.9 03/04/2016 0738   MONOABS 0.5 03/04/2016 0738   EOSABS 0.2 03/04/2016 0738   BASOSABS 0.1 03/04/2016 0738   CMP     Component Value Date/Time   NA 143 03/04/2016 0738   K 3.6 03/04/2016 0738   CL 106 03/04/2016 0738   CO2 31 03/04/2016 0738   GLUCOSE 84 03/04/2016 0738   BUN 15 03/04/2016 0738   CREATININE 0.97 03/04/2016 0738   CALCIUM 9.4 03/04/2016 0738   PROT 6.2 03/04/2016 0738   ALBUMIN 3.5 03/04/2016 0738   AST 15 03/04/2016 0738   ALT 19 03/04/2016 0738   ALKPHOS 52 03/04/2016 0738   BILITOT 0.4 03/04/2016 0738   GFRNONAA 76 (L) 10/29/2013 1700   GFRAA 89 (L) 10/29/2013 1700     UPPER GI SERIES WITH SMALL BOWEL FOLLOW-THROUGH   FLUOROSCOPY TIME:  Fluoroscopy Time:  4 minutes 53 seconds   Radiation Exposure Index (if provided by the fluoroscopic device): 82 mGy   Number of Acquired Spot Images: 21   TECHNIQUE: Combined double contrast and single contrast upper GI series using effervescent crystals, thick barium, and thin barium. Subsequently, serial images of the small bowel were obtained including spot views of the terminal ileum.   COMPARISON:  CT abdomen pelvis 11/30/2015.   FINDINGS: Scout view of the abdomen shows a fair amount of stool in the colon. Surgical clips in the right upper quadrant.   Esophageal motility is normal. No esophageal fold thickening, stricture or obstruction. Stomach is largely within normal limits with the exception of a high-grade stricture in the peripyloric stomach. Duodenal bulb and small bowel fold pattern are normal. Mild irregularity of the terminal ileum. The ileocecal junction failed to distend, despite changes in patient position.   IMPRESSION: 1. Peripyloric high-grade stricture, as on recent endoscopy. 2. Slight irregularity of the terminal ileum, consistent with the given history of Crohn disease. 3. Failure to adequately distend the ileocecal junction. Difficult to exclude a  stricture.     Electronically Signed   By: Lorin Picket M.D.   On: 05/16/2016     Assessment & Plan:  51 year old female with a history of ileocolonic Crohn's disease, gastric ulcer with pyloric stricture possibly related to Crohn's disease, recent diagnosis of melanoma who is here for follow-up.  1. 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 I do feel that biologic therapy is indicated but she is nervous regarding her reaction to Humira and possibly to Remicade. In light of the rash I do not think we should give further doses of Remicade. She has not tolerated now to anti-TNF medications. Other options include Stelara and Entyvio and we discussed that Weyman Rodney tends to be less effective for small bowel disease and colonic disease. She remains nervous about any biologic. We spent time today discussing immunomodulator therapy, specifically azathioprine. We discussed the risk of rash, nausea, pancreatitis, lymphoma, leukopenia and hepatotoxicity. She has a hard time thinking about escalating therapy due to the fact that she is now feeling well. Explained that it is year to maintain and control the disease rather than respond to flares and complications. She does seem to understand this --TPMT today --Hold on therapy at this time as she completes further resection for new melanoma --Check CBC, CMP and CRP today --  Follow-up in 6 weeks to discuss further treatment and escalation of therapy.  2. Pyloric stricture/history of ulcers -- pyloric stricture not able to be traversed by endoscope. High-grade stricture seen by upper GI series recently. Duodenal bulb was normal. Surprisingly, she is having no symptoms of gastric outlet obstruction. We discussed possibility of dilation with fluoroscopy and even resection surgically. With the lack of symptoms we will monitor this for now. We have performed biopsies which of been benign. It is possible this is Crohn's related. We discussed that in my opinion medical  therapy will not improve the stricture. I would like her to continue Nexium 40 mg once daily. Avoid NSAIDs  3. Melanoma -- recent diagnosis with pending wider resection per dermatology.  Return in 6 weeks to discuss further Crohn's management after hopefully definitive melanoma treatment

## 2016-06-03 NOTE — Patient Instructions (Addendum)
Your physician has requested that you go to the basement for the following lab work before leaving today: TPMT, CBC, CMP, CRP  We have sent the following medications to your pharmacy for you to pick up at your convenience: Nexium 40 mg daily  Please purchase the following medications over the counter and take as directed: Miralax 17 grams daily  Please follow up with Dr [name] on Monday, 08/01/16 @ 1:30 pm.  If you are age 51 or older, your body mass index should be between 23-30. Your Body mass index is 25.78 kg/m. If this is out of the aforementioned range listed, please consider follow up with your Primary Care Provider.  If you are age 6 or younger, your body mass index should be between 19-25. Your Body mass index is 25.78 kg/m. If this is out of the aformentioned range listed, please consider follow up with your Primary Care Provider.

## 2016-06-09 ENCOUNTER — Other Ambulatory Visit: Payer: Self-pay | Admitting: Physician Assistant

## 2016-06-10 LAB — THIOPURINE METHYLTRANSFERASE (TPMT), RBC: THIOPURINE METHYLTRANSFERASE, RBC: 19 nmol/h/mL

## 2016-06-16 ENCOUNTER — Encounter (HOSPITAL_COMMUNITY): Admission: RE | Admit: 2016-06-16 | Payer: BLUE CROSS/BLUE SHIELD | Source: Ambulatory Visit

## 2016-07-05 ENCOUNTER — Telehealth: Payer: Self-pay | Admitting: Internal Medicine

## 2016-07-05 MED ORDER — PREDNISONE 10 MG PO TABS: ORAL_TABLET | ORAL | 0 refills | Status: DC

## 2016-07-05 NOTE — Telephone Encounter (Signed)
Per Dr [name], patient not currently on prednisone.

## 2016-07-05 NOTE — Telephone Encounter (Signed)
She recently has undergone melanoma resection and we were awaiting completion of this treatment before further escalation for Crohn's with biologic or immunomodulators She has follow-up with me within 1 month Resume prednisone 20 mg daily 2 weeks and decrease to 10 mg daily until follow-up with me

## 2016-07-05 NOTE — Telephone Encounter (Signed)
Spoke with pt and she is aware, script sent to pharmacy.

## 2016-07-05 NOTE — Telephone Encounter (Signed)
Called pt and she states that about a week ago she started having multiple loose stools again. Reports 5 loose stools/day, no blood see in the stool. Pt wanted a refill on prednisone so she could start it again. Please advise.

## 2016-07-18 ENCOUNTER — Telehealth: Payer: Self-pay | Admitting: Internal Medicine

## 2016-07-19 NOTE — Telephone Encounter (Signed)
Pt requesting letter to excuse her from [place] duty.

## 2016-07-20 ENCOUNTER — Encounter: Payer: Self-pay | Admitting: Internal Medicine

## 2016-07-20 NOTE — Telephone Encounter (Signed)
[employer] excuse letter written, see letters tab

## 2016-07-20 NOTE — Telephone Encounter (Signed)
Letter mailed to pt and she is aware.

## 2016-08-01 ENCOUNTER — Encounter: Payer: Self-pay | Admitting: Internal Medicine

## 2016-08-01 ENCOUNTER — Ambulatory Visit (INDEPENDENT_AMBULATORY_CARE_PROVIDER_SITE_OTHER): Payer: BLUE CROSS/BLUE SHIELD | Admitting: Internal Medicine

## 2016-08-01 VITALS — BP 120/90 | HR 64 | Ht 62.0 in | Wt 151.0 lb

## 2016-08-01 DIAGNOSIS — K5000 Crohn's disease of small intestine without complications: Secondary | ICD-10-CM

## 2016-08-01 DIAGNOSIS — K311 Adult hypertrophic pyloric stenosis: Secondary | ICD-10-CM | POA: Diagnosis not present

## 2016-08-01 DIAGNOSIS — Z79899 Other long term (current) drug therapy: Secondary | ICD-10-CM

## 2016-08-01 MED ORDER — AZATHIOPRINE 50 MG PO TABS: ORAL_TABLET | ORAL | 0 refills | Status: DC

## 2016-08-01 NOTE — Patient Instructions (Addendum)
Continue Nexium.  Continue to avoid NSAID's (ibupofen, naproxen, aleve)  We have sent the following medications to your pharmacy for you to pick up at your convenience: Azathioprine  Start Azathioprine 50 mg 1 tablet daily x 7 days. If tolerated, increase to 125 mg once daily. 7-10 days after the dose increase to 125 mg, please get your labwork drawn (CBC, CMP) at your primary care doctor's office and have faxed to [phone].  You will need labs drawn every 2 weeks for the next 6 weeks,  Please follow up with Dr [name] in 3 months.  If you are age 51 or older, your body mass index should be between 23-30. Your Body mass index is 27.62 kg/m. If this is out of the aforementioned range listed, please consider follow up with your Primary Care Provider.  If you are age 51 or younger, your body mass index should be between 19-25. Your Body mass index is 27.62 kg/m. If this is out of the aformentioned range listed, please consider follow up with your Primary Care Provider.

## 2016-08-02 NOTE — Progress Notes (Signed)
Subjective:    Patient ID: Renee Atkinson, female    DOB: 06-06-1965, 51 y.o.   MRN: [id number]  HPI Renee Atkinson a 51 year old female with history of ileal Crohn's disease diagnosed in 2015, gastric ulcer with pyloric stricture possibly related to Crohn's disease, history of melanoma no status post resection who is here for follow-up. She was last seen on 06/03/2016. She is here today with her husband.  After her last visit we decided to discontinue Remicade due to rash. She previously had allergy to Humira. We did not add therapy after last visit as she was awaiting wider resection for a newly diagnosed melanoma on her abdomen. Since last being seen here she developed loose stools and she contacted the office for a refill of prednisone. Prescription was to take 20 mg 1-2 weeks and then taper to 10 mg until follow-up. She only took prednisone for 2 weeks and had resolution of diarrhea. Today she reports she is feeling very well without complaint. She denies abdominal pain. Stools of been more formed and regular. She denies blood in her stool or melena. She is now off of prednisone and has been for several weeks. She denies upper GI complaint including nausea, vomiting, abdominal pain associated with eating. Denies heartburn she is taking Nexium 40 mg daily. She has not needed Zofran.   Review of Systems As per history of present illness, otherwise negative  Current Medications, Allergies, Past Medical History, Past Surgical History, Family History and Social History were reviewed in [employer] record.     Objective:   Physical Exam BP 120/90   Pulse 64   Ht 5' 2"  (1.575 m)   Wt 151 lb (68.5 kg)   BMI 27.62 kg/m  Constitutional: Well-developed and well-nourished. No distress. HEENT: Normocephalic and atraumatic. Oropharynx is clear and moist. Conjunctivae are normal.  No scleral icterus. Neck: Neck supple. Trachea midline. Cardiovascular: Normal rate, regular  rhythm and intact distal pulses. No M/R/G Pulmonary/chest: Effort normal and breath sounds normal. No wheezing, rales or rhonchi. Abdominal: Soft, nontender, nondistended. Bowel sounds active throughout. There are no masses palpable. No hepatosplenomegaly. Extremities: no clubbing, cyanosis, or edema Neurological: Alert and oriented to person place and time. Skin: Skin is warm and dry.Well-healed incision right inferior to umbilicus Psychiatric: Normal mood and affect. Behavior is normal.  CBC    Component Value Date/Time   WBC 8.2 06/03/2016 0959   RBC 4.64 06/03/2016 0959   HGB 14.1 06/03/2016 0959   HCT 41.6 06/03/2016 0959   PLT 301.0 06/03/2016 0959   MCV 89.6 06/03/2016 0959   MCH 29.6 10/29/2013 1700   MCHC 33.9 06/03/2016 0959   RDW 15.6 (H) 06/03/2016 0959   LYMPHSABS 3.3 06/03/2016 0959   MONOABS 0.5 06/03/2016 0959   EOSABS 0.1 06/03/2016 0959   BASOSABS 0.1 06/03/2016 0959   CMP     Component Value Date/Time   NA 139 06/03/2016 0959   K 3.7 06/03/2016 0959   CL 107 06/03/2016 0959   CO2 30 06/03/2016 0959   GLUCOSE 92 06/03/2016 0959   BUN 18 06/03/2016 0959   CREATININE 0.85 06/03/2016 0959   CALCIUM 9.3 06/03/2016 0959   PROT 6.5 06/03/2016 0959   ALBUMIN 3.8 06/03/2016 0959   AST 14 06/03/2016 0959   ALT 12 06/03/2016 0959   ALKPHOS 49 06/03/2016 0959   BILITOT 0.4 06/03/2016 0959   GFRNONAA 76 (L) 10/29/2013 1700   GFRAA 89 (L) 10/29/2013 1700  Assessment & Plan:  51 year old female with history of ileocolonic Crohn's disease diagnosed in 2015, gastric ulcer with pyloric stricture possibly related to Crohn's disease, history of melanoma no status post resection who is here for follow-up.   1. Ileal Crohn's disease with possible involvement of distal stomach -- clinically she is feeling well. She is hesitant to escalate therapy given how well she is feeling. She did respond rapidly to prednisone when used within the last month for loose  stools. I recommendation is that we escalate therapy for her known Crohn's disease which is felt to be fistulizing. Escalation of therapy would be expected to help prevent/avoid morbidity associated with known Crohn's and also complication such as obstruction, further fistulization and mitigate potential for abscess formation. We discussed Entyvio and Stelara. We also discussed azathioprine. She feels strongly that she would like to avoid Biologics. I have recommended azathioprine. We also discussed the risks in great detail including the risk of infection, hepatotoxicity, leukopenia, pancreatitis, nausea, malignancy (specifically lymphoma).  --After this discussion she is agreeable to beginning azathioprine therapy. We'll start 50 mg daily 7 days and if tolerated increase to 125 mg daily. --Check CBC and CMP 7-10 days after escalating to 125 mg dose. Will need LFTs and CBC every 2 weeks for the first 6 weeks and every 3 months for the first year. This can be done locally and the results faxed to me --I asked her to continue to avoid NSAIDs  2. Pyloric stricture -- possibly related IBD. Benign by biopsy. Not symptomatic and so we are not proceeding with endoscopic dilation or surgery for this issue at present. Continue Nexium 40 mg daily. Avoid NSAIDs  3. Melanoma -- in remission after surgical resection. Following closely with dermatology  25 minutes spent with the patient today. Greater than 50% was spent in counseling and coordination of care with the patient

## 2016-08-12 ENCOUNTER — Encounter: Payer: Self-pay | Admitting: Vascular Surgery

## 2016-08-12 ENCOUNTER — Encounter (HOSPITAL_COMMUNITY): Payer: BLUE CROSS/BLUE SHIELD

## 2016-08-15 ENCOUNTER — Telehealth: Payer: Self-pay | Admitting: Internal Medicine

## 2016-08-15 NOTE — Telephone Encounter (Signed)
While off the meds, this is not necessary

## 2016-08-15 NOTE — Telephone Encounter (Signed)
Pt started taking the 167m dose the week of the 15th. She is aware and knows to hold med for 2 weeks and call with an update. Pt wants to know if she should still come for labs this week. Please advise.

## 2016-08-15 NOTE — Telephone Encounter (Signed)
She what dose she is taking, is she still at 50 mg daily? (or has she increased already?) I would ask that she hold the medication for 2 weeks to see if/how quickly these symptoms resolve. I may have her try again after a break to ensure the med is the cause for these symptoms.  If so, then we would not be able to use it for her Crohn's disease After 2 weeks off, she needs to call and update me how she is feeling (call sooner if not better off the med)

## 2016-08-15 NOTE — Telephone Encounter (Signed)
Pt states that she started taking the imuran 2 weeks ago. She increased the dose on the second week and states that since then she has been feeling really bad. Reports she has had a headache for 4 days and nothing is helping it. Reports she has no energy and is having flu-like aches. Reports she has never felt this bad. Please advise.

## 2016-08-15 NOTE — Telephone Encounter (Signed)
Pt aware.

## 2016-08-17 ENCOUNTER — Other Ambulatory Visit: Payer: Self-pay | Admitting: Internal Medicine

## 2016-08-21 ENCOUNTER — Encounter (HOSPITAL_COMMUNITY): Payer: Self-pay

## 2016-08-21 ENCOUNTER — Emergency Department (HOSPITAL_COMMUNITY): Payer: BLUE CROSS/BLUE SHIELD

## 2016-08-21 ENCOUNTER — Emergency Department (HOSPITAL_COMMUNITY)
Admission: EM | Admit: 2016-08-21 | Discharge: 2016-08-21 | Disposition: A | Discharge status: Home / Self Care | Payer: BLUE CROSS/BLUE SHIELD | Attending: Emergency Medicine | Admitting: Emergency Medicine

## 2016-08-21 DIAGNOSIS — F17210 Nicotine dependence, cigarettes, uncomplicated: Secondary | ICD-10-CM | POA: Insufficient documentation

## 2016-08-21 DIAGNOSIS — K279 Peptic ulcer, site unspecified, unspecified as acute or chronic, without hemorrhage or perforation: Secondary | ICD-10-CM | POA: Diagnosis not present

## 2016-08-21 DIAGNOSIS — Z85820 Personal history of malignant melanoma of skin: Secondary | ICD-10-CM | POA: Diagnosis not present

## 2016-08-21 DIAGNOSIS — R1013 Epigastric pain: Secondary | ICD-10-CM | POA: Diagnosis present

## 2016-08-21 DIAGNOSIS — K5000 Crohn's disease of small intestine without complications: Secondary | ICD-10-CM | POA: Diagnosis not present

## 2016-08-21 LAB — URINALYSIS, ROUTINE W REFLEX MICROSCOPIC
Bacteria, UA: NONE SEEN
Glucose, UA: NEGATIVE mg/dL
Hgb urine dipstick: NEGATIVE
Ketones, ur: 5 mg/dL — AB
Nitrite: NEGATIVE
PROTEIN: 30 mg/dL — AB
pH: 5 (ref 5.0–8.0)

## 2016-08-21 LAB — COMPREHENSIVE METABOLIC PANEL
ALBUMIN: 4.1 g/dL (ref 3.5–5.0)
ALT: 12 U/L — AB (ref 14–54)
AST: 20 U/L (ref 15–41)
Alkaline Phosphatase: 57 U/L (ref 38–126)
Anion gap: 8 (ref 5–15)
BUN: 23 mg/dL — AB (ref 6–20)
CHLORIDE: 103 mmol/L (ref 101–111)
CO2: 28 mmol/L (ref 22–32)
CREATININE: 1.01 mg/dL — AB (ref 0.44–1.00)
Calcium: 9.6 mg/dL (ref 8.9–10.3)
GFR calc Af Amer: >60 mL/min (ref >60)
GLUCOSE: 89 mg/dL (ref 65–99)
Potassium: 3.8 mmol/L (ref 3.5–5.1)
Sodium: 139 mmol/L (ref 135–145)
Total Bilirubin: 0.9 mg/dL (ref 0.3–1.2)
Total Protein: 7.7 g/dL (ref 6.5–8.1)

## 2016-08-21 LAB — CBC
HCT: 48 % — ABNORMAL HIGH (ref 36.0–46.0)
Hemoglobin: 16.2 g/dL — ABNORMAL HIGH (ref 12.0–15.0)
MCH: 30.6 pg (ref 26.0–34.0)
MCHC: 33.8 g/dL (ref 30.0–36.0)
MCV: 90.7 fL (ref 78.0–100.0)
PLATELETS: 387 10*3/uL (ref 150–400)
RBC: 5.29 MIL/uL — ABNORMAL HIGH (ref 3.87–5.11)
RDW: 15.4 % (ref 11.5–15.5)
WBC: 12.1 10*3/uL — AB (ref 4.0–10.5)

## 2016-08-21 LAB — LIPASE, BLOOD: Lipase: 21 U/L (ref 11–51)

## 2016-08-21 MED ORDER — ONDANSETRON HCL 4 MG/2ML IJ SOLN
4.0000 mg | Freq: Once | INTRAMUSCULAR | Status: AC
  Administered 2016-08-21: 4 mg via INTRAVENOUS
  Filled 2016-08-21: qty 2

## 2016-08-21 MED ORDER — MORPHINE SULFATE (PF) 4 MG/ML IV SOLN
4.0000 mg | Freq: Once | INTRAVENOUS | Status: AC
  Administered 2016-08-21: 4 mg via INTRAVENOUS
  Filled 2016-08-21: qty 1

## 2016-08-21 MED ORDER — GI COCKTAIL ~~LOC~~
30.0000 mL | Freq: Once | ORAL | Status: AC
  Administered 2016-08-21: 30 mL via ORAL
  Filled 2016-08-21: qty 30

## 2016-08-21 MED ORDER — IOPAMIDOL (ISOVUE-300) INJECTION 61%
INTRAVENOUS | Status: AC
  Filled 2016-08-21: qty 100

## 2016-08-21 MED ORDER — SODIUM CHLORIDE 0.9 % IV BOLUS (SEPSIS)
1000.0000 mL | Freq: Once | INTRAVENOUS | Status: AC
  Administered 2016-08-21: 1000 mL via INTRAVENOUS

## 2016-08-21 MED ORDER — IOPAMIDOL (ISOVUE-300) INJECTION 61%
INTRAVENOUS | Status: AC
  Administered 2016-08-21: 100 mL
  Filled 2016-08-21: qty 30

## 2016-08-21 MED ORDER — SUCRALFATE 1 G PO TABS: 1.0000 g | ORAL_TABLET | Freq: Three times a day (TID) | ORAL | 0 refills | Status: DC

## 2016-08-21 MED ORDER — SUCRALFATE 1 G PO TABS
1.0000 g | ORAL_TABLET | Freq: Once | ORAL | Status: AC
  Administered 2016-08-21: 1 g via ORAL
  Filled 2016-08-21: qty 1

## 2016-08-21 NOTE — ED Notes (Signed)
Patient transported to CT 

## 2016-08-21 NOTE — Discharge Instructions (Signed)
Please take Carafate as prescribed for your discomfort.  Please call and follow up closely with your GI specialist for further management of your Crohn's disease.

## 2016-08-21 NOTE — ED Provider Notes (Signed)
[facility] DEPT Provider Note   CSN: [id number] Arrival date & time: 08/21/16  1314     History   Chief Complaint Chief Complaint  Patient presents with  . Abdominal Pain    HPI Renee Atkinson is a 51 y.o. female.  HPI   51 year old female with history of Crohn's disease, diverticulosis, GERD presenting complaining of abdominal pain. Patient reports gradual onset of intermittent epigastric abdominal pain ongoing for the past 3 days. Her pain is moderate in intensity, currently rates at 7 out of 10 but can be 10 out of 10 when severe. She is afraid to eat as it can triggers the pain. Pain feels similar to prior ulcer that she had in the past. She has tried over-the-counter medication including H2 blocker, Pepto-Bismol with minimal improvement. She denies any associated fever, chills, chest pain, shortness of breath, productive cough, back pain, dysuria, hematuria, hematochezia or melena. She was on Imuran but has been off of it for the past 2 weeks due to intolerance.  Past Medical History:  Diagnosis Date  . Anxiety   . Crohn's disease of ileum ([place]) [place] 2014  . Depression   . Diverticulosis   . GERD (gastroesophageal reflux disease)   . Hiatal hernia   . Insomnia   . Malignant melanoma ([place])   . Schatzki's ring   . SVD (spontaneous vaginal delivery)    x 2    Patient Active Problem List   Diagnosis Date Noted  . Gastric ulcer 03/15/2016  . Crohn's disease with complication ([place]) 69/45/0388  . Diarrhea 01/20/2016  . Nausea with vomiting 01/20/2016  . S/P TAH-BSO 04/02/2013  . Rectal bleeding 03/13/2013  . Uterine mass 02/16/2013  . Fibroid uterus 02/14/2013  . Crohn's disease of small intestine ([place]) 01/23/2013    Past Surgical History:  Procedure Laterality Date  . ABDOMINAL HYSTERECTOMY N/A 04/02/2013   Procedure: HYSTERECTOMY ABDOMINAL;  Surgeon: Sharene Butters, MD;  Location: [facility];  Service: Gynecology;  Laterality: N/A;  . BIOPSY N/A 05/14/2013   Procedure: BIOPSY;  Surgeon: Danie Binder, MD;  Location: [facility];  Service: Endoscopy;  Laterality: N/A;  biopsies of the ileum, random colon biopsies & rectal biopsies  . CHOLECYSTECTOMY    . COLONOSCOPY N/A 03/18/2013   Dr. [name]: ileitis, ulcer at ICV, mild colitis in ascending colon and sigmoid colon, moderate sized internal hemorrhoids. bx inconclusive either related to crohns or NSAIDs  . COLONOSCOPY WITH PROPOFOL N/A 05/14/2013   SLF:Non-bleeding mucosal ulceration in the terminal ileum and at the ileocecal valve MOST LIKELY DUE TO CROHN'S DISEASE/Normal colon/Normal mucosa in RECTUM. Bx from TI, chronic active ileitis ?crohn's or NSAIDs. random colon and rectum bx negative.  Marland Kitchen SALPINGOOPHORECTOMY Bilateral 04/02/2013   Procedure: SALPINGO OOPHORECTOMY;  Surgeon: Sharene Butters, MD;  Location: [facility];  Service: Gynecology;  Laterality: Bilateral;  . TUBAL LIGATION      OB History    No data available       Home Medications    Prior to Admission medications   Medication Sig Start Date End Date Taking? Authorizing Provider  acetaminophen (TYLENOL) 500 MG tablet Take 500 mg by mouth every 6 (six) hours as needed.    Historical Provider, MD  azaTHIOprine (IMURAN) 50 MG tablet Take 1 tablet by mouth once daily x 7 days. If tolerated, increase to 2.5 tablets daily thereafter 08/01/16   Jerene Bears, MD  citalopram (CELEXA) 40 MG tablet Take 40 mg by mouth at bedtime.  Historical Provider, MD  clonazePAM (KLONOPIN) 0.5 MG tablet Take 0.5 mg by mouth at bedtime.    Historical Provider, MD  esomeprazole (NEXIUM) 40 MG capsule Take 1 capsule (40 mg total) by mouth daily. 06/03/16   Jerene Bears, MD  ondansetron (ZOFRAN) 4 MG tablet TAKE ONE TABLET BY MOUTH EVERY 6 HOURS AS NEEDED FOR NAUSEA AND FOR VOMITING 08/17/16   Jerene Bears, MD  tiZANidine (ZANAFLEX) 4 MG tablet  02/01/16   Historical Provider, MD  triamterene-hydrochlorothiazide (MAXZIDE-25) 37.5-25 MG tablet  01/08/16   Historical  Provider, MD    Family History Family History  Problem Relation Age of Onset  . Prostate cancer Father   . Diverticulitis Mother     complicated, colostomy bag temporarily  . Irritable bowel syndrome Mother   . Inflammatory bowel disease Neg Hx   . Colon cancer Neg Hx     Social History Social History  Substance Use Topics  . Smoking status: Current Every Day Smoker    Packs/day: 0.50    Years: 15.00    Types: Cigarettes  . Smokeless tobacco: Never Used  . Alcohol use No     Allergies   Dilaudid [hydromorphone hcl]; Humira [adalimumab]; Morphine and related; Benadryl [diphenhydramine]; and Remicade [infliximab]   Review of Systems Review of Systems  All other systems reviewed and are negative.    Physical Exam Updated Vital Signs BP 111/90 (BP Location: Left Arm)   Pulse 95   Temp 98.3 F (36.8 C) (Oral)   Resp 16   Ht 5' 3"  (1.6 m)   Wt 67.6 kg   SpO2 100%   BMI 26.39 kg/m   Physical Exam  Constitutional: She appears well-developed and well-nourished. No distress.  HENT:  Head: Atraumatic.  Eyes: Conjunctivae are normal.  Neck: Neck supple.  Cardiovascular: Normal rate and regular rhythm.   Pulmonary/Chest: Effort normal and breath sounds normal. No respiratory distress. She has no wheezes. She has no rales.  Abdominal: Soft. Bowel sounds are normal. She exhibits no distension. There is tenderness (Mild generalized abdominal tenderness without focal point tenderness, no rebound or guarding.).  Neurological: She is alert.  Skin: No rash noted.  Psychiatric: She has a normal mood and affect.  Nursing note and vitals reviewed.    ED Treatments / Results  Labs (all labs ordered are listed, but only abnormal results are displayed) Labs Reviewed  COMPREHENSIVE METABOLIC PANEL - Abnormal; Notable for the following:       Result Value   BUN 23 (*)    Creatinine, Ser 1.01 (*)    ALT 12 (*)    All other components within normal limits  CBC - Abnormal;  Notable for the following:    WBC 12.1 (*)    RBC 5.29 (*)    Hemoglobin 16.2 (*)    HCT 48.0 (*)    All other components within normal limits  URINALYSIS, ROUTINE W REFLEX MICROSCOPIC - Abnormal; Notable for the following:    Color, Urine AMBER (*)    APPearance HAZY (*)    Specific Gravity, Urine >1.046 (*)    Bilirubin Urine MODERATE (*)    Ketones, ur 5 (*)    Protein, ur 30 (*)    Leukocytes, UA TRACE (*)    Squamous Epithelial / LPF 0-5 (*)    All other components within normal limits  LIPASE, BLOOD    EKG  EKG Interpretation None       Radiology Ct Abdomen Pelvis W Contrast  Result Date: 08/21/2016 CLINICAL DATA:  Abdominal pain and nausea. History of Crohn's disease. EXAM: CT ABDOMEN AND PELVIS WITH CONTRAST TECHNIQUE: Multidetector CT imaging of the abdomen and pelvis was performed using the standard protocol following bolus administration of intravenous contrast. CONTRAST:  <See Chart> ISOVUE-300 IOPAMIDOL (ISOVUE-300) INJECTION 61% COMPARISON:  11/30/2015 FINDINGS: Lower chest: Normal Hepatobiliary: Liver parenchyma is normal. Previous cholecystectomy. Pancreas: Normal Spleen: Normal Adrenals/Urinary Tract: Adrenal glands are normal. Urinary tract contrast present initially. Therefore cannot evaluate for renal calculi. No sign of mass, cyst or obstruction. No bladder abnormality. Stomach/Bowel: No evidence of bowel obstruction. There is a long segment of mid to distal ileum which shows wall thickening and segmental dilatation, consistent with Crohn's involvement. The terminal ileum itself shows only mild wall thickening. No evidence of abscess, free fluid or air. Vascular/Lymphatic: Normal Reproductive: Previous hysterectomy.  No pelvic mass. Other: No free fluid or air. Musculoskeletal: Negative IMPRESSION: Chronic Crohn's involvement of the mid through distal ileum with wall thickening. Segmental dilatation in the midportion of that segment with a diameter up to 5 cm. This  could indicate an inflammatory stricture at the transition zone. Electronically Signed   By: Nelson Chimes M.D.   On: 08/21/2016 15:51    Procedures Procedures (including critical care time)  Medications Ordered in ED Medications  iopamidol (ISOVUE-300) 61 % injection (not administered)  sucralfate (CARAFATE) tablet 1 g (not administered)  gi cocktail (Maalox,Lidocaine,Donnatal) (30 mLs Oral Given 08/21/16 1426)  sodium chloride 0.9 % bolus 1,000 mL (1,000 mLs Intravenous New Bag/Given 08/21/16 1426)  iopamidol (ISOVUE-300) 61 % injection (100 mLs  Contrast Given 08/21/16 1527)  morphine 4 MG/ML injection 4 mg (4 mg Intravenous Given 08/21/16 1513)  ondansetron (ZOFRAN) injection 4 mg (4 mg Intravenous Given 08/21/16 1513)     Initial Impression / Assessment and Plan / ED Course  I have reviewed the triage vital signs and the nursing notes.  Pertinent labs & imaging results that were available during my care of the patient were reviewed by me and considered in my medical decision making (see chart for details).     BP (!) 121/101 (BP Location: Left Arm)   Pulse 68   Temp 98.3 F (36.8 C) (Oral)   Resp 18   Ht 5' 3"  (1.6 m)   Wt 67.6 kg   SpO2 100%   BMI 26.39 kg/m    Final Clinical Impressions(s) / ED Diagnoses   Final diagnoses:  PUD (peptic ulcer disease)  Crohn's disease of small intestine without complication (HCC)    New Prescriptions New Prescriptions   SUCRALFATE (CARAFATE) 1 G TABLET    Take 1 tablet (1 g total) by mouth 4 (four) times daily -  with meals and at bedtime.   2:17 PM Patient with history of ulcer, and Crohn's disease who is here with epigastric abdominal pain. She does have a generalized abdominal tenderness on exam. Given her history, we'll obtain abdominal pelvic CT scan for further evaluation. GI cocktail given.  4:22 PM Mild elevated WBC of 12.1. Patient appears to be dehydrated since her hemoglobin is hemoconcentrated at 16.2. IV fluid given.  Abdominal pelvis CT scan demonstrated chronic Crohn's involvement of the mid through distal ileum with wall thickening. Half of patient's symptom is still suggestive of PUD. She will be discharge with Carafate. She agrees to call and follow-up closely with her GI specialist tomorrow. Otherwise patient felt comfortable going home.   Domenic Moras, PA-C 08/21/16 1627    Quintella Reichert, MD  08/22/16 0004  

## 2016-08-21 NOTE — ED Triage Notes (Signed)
Pt here with abdominal pain.  Pt states nausea.  Hx of Crohn's disease.

## 2016-08-22 ENCOUNTER — Ambulatory Visit (INDEPENDENT_AMBULATORY_CARE_PROVIDER_SITE_OTHER): Payer: BLUE CROSS/BLUE SHIELD | Admitting: Vascular Surgery

## 2016-08-22 ENCOUNTER — Encounter: Payer: Self-pay | Admitting: Vascular Surgery

## 2016-08-22 ENCOUNTER — Telehealth: Payer: Self-pay

## 2016-08-22 VITALS — BP 105/78 | HR 86 | Temp 98.3°F | Resp 16 | Ht 64.0 in | Wt 143.0 lb

## 2016-08-22 DIAGNOSIS — I83891 Varicose veins of right lower extremities with other complications: Secondary | ICD-10-CM

## 2016-08-22 NOTE — Progress Notes (Signed)
Subjective:     Patient ID: Renee Atkinson, female   DOB: December 27, 1965, 51 y.o.   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 She noticed a small varicose vein in this area over the past few years and about 1-2 months ago this became firm and slightly darker. She has no history of DVT thrombo-flight of stasis ulcers bleeding. She does develop some swelling as the day progresses as she is on her feet all day. She does not elastic compression stockings. This small "not" causes some mild aching discomfort at times but is not severe.  Past Medical History:  Diagnosis Date  . Anxiety   . Crohn's disease of ileum ([place]) [place] 2014  . Depression   . Diverticulosis   . GERD (gastroesophageal reflux disease)   . Hiatal hernia   . Insomnia   . Malignant melanoma ([place])   . Schatzki's ring   . SVD (spontaneous vaginal delivery)    x 2    Social History  Substance Use Topics  . Smoking status: Current Every Day Smoker    Packs/day: 0.50    Years: 15.00    Types: Cigarettes  . Smokeless tobacco: Never Used  . Alcohol use No    Family History  Problem Relation Age of Onset  . Prostate cancer Father   . Diverticulitis Mother     complicated, colostomy bag temporarily  . Irritable bowel syndrome Mother   . Inflammatory bowel disease Neg Hx   . Colon cancer Neg Hx     Allergies  Allergen Reactions  . Dilaudid [Hydromorphone Hcl] Other (See Comments)    Severe headaches  . Humira [Adalimumab]     FEVER, MYALGIAS, NAUSEA, VOMITING  . Morphine And Related Nausea And Vomiting  . Benadryl [Diphenhydramine] Anxiety    Restless, jittery, inability to be still  . Remicade [Infliximab] Rash     Current Outpatient Prescriptions:  .  acetaminophen (TYLENOL) 500 MG tablet, Take 500 mg by mouth every 6 (six) hours as needed., Disp: , Rfl:  .  Adapalene-Benzoyl Peroxide 0.1-2.5 % gel, Apply 1 application topically at bedtime.  , Disp: , Rfl:  .  azaTHIOprine (IMURAN) 50 MG tablet, Take 1 tablet by mouth once daily x 7 days. If tolerated, increase to 2.5 tablets daily thereafter, Disp: 75 tablet, Rfl: 0 .  azithromycin (ZITHROMAX) 250 MG tablet, TAKE TWO (2) TABLETS ON FIRST DAY, THEN TAKE ONE (1) TABLET EACH DAY FOR THE NEXT 4 DAYS., Disp: , Rfl: 0 .  citalopram (CELEXA) 40 MG tablet, Take 40 mg by mouth at bedtime., Disp: , Rfl:  .  clonazePAM (KLONOPIN) 1 MG tablet, TAKE 1 TO 1 & 1/2 TABLETS TWICE DAILY AS NEEDED, Disp: , Rfl: 2 .  diphenoxylate-atropine (LOMOTIL) 2.5-0.025 MG tablet, TAKE TWO (2) TABLETS BY MOUTH THREE TIMES DAILY AS NEEDED FOR DIARRHEA., Disp: , Rfl: 2 .  esomeprazole (NEXIUM) 40 MG capsule, Take 1 capsule (40 mg total) by mouth daily., Disp: 30 capsule, Rfl: 3 .  fluticasone (FLONASE) 50 MCG/ACT nasal spray, Place 1 spray into both nostrils daily. , Disp: , Rfl:  .  ondansetron (ZOFRAN) 4 MG tablet, TAKE ONE TABLET BY MOUTH EVERY 6 HOURS AS NEEDED FOR NAUSEA AND FOR VOMITING, Disp: 30 tablet, Rfl: 0 .  sucralfate (CARAFATE) 1 g tablet, Take 1 tablet (1 g total) by mouth 4 (four) times daily -  with meals and at bedtime., Disp: 30  tablet, Rfl: 0 .  tiZANidine (ZANAFLEX) 4 MG tablet, Take 4 mg by mouth every 8 (eight) hours as needed for muscle spasms. , Disp: , Rfl:  .  triamterene-hydrochlorothiazide (MAXZIDE-25) 37.5-25 MG tablet, Take 1 tablet by mouth daily as needed (for swelling). , Disp: , Rfl:   Current Facility-Administered Medications:  .  0.9 %  sodium chloride infusion, 500 mL, Intravenous, Continuous, Lajuan Lines Pyrtle, MD .  0.9 %  sodium chloride infusion, 500 mL, Intravenous, Continuous, Jerene Bears, MD .  acetaminophen (TYLENOL) tablet 650 mg, 650 mg, Oral, Once, Jerene Bears, MD .  inFLIXimab (REMICADE) 5 mg/kg = 300 mg in sodium chloride 0.9 % 250 mL infusion, 5 mg/kg, Intravenous, Once, Jerene Bears, MD .  inFLIXimab (REMICADE) 5 mg/kg = 300 mg in sodium chloride 0.9 % 250 mL  infusion, 5 mg/kg, Intravenous, Once, Jerene Bears, MD .  loratadine (CLARITIN) tablet 10 mg, 10 mg, Oral, Once, Jerene Bears, MD  Vitals:   08/22/16 1312  BP: 105/78  Pulse: 86  Resp: 16  Temp: 98.3 F (36.8 C)  SpO2: 100%  Weight: 143 lb (64.9 kg)  Height: 5' 4"  (1.626 m)    Body mass index is 24.55 kg/m.         Review of Systems Denies chest pain, dyspnea on exertion, PND, orthopnea. Patient does have history of Crohn's disease    Objective:   Physical Exam BP 105/78 (BP Location: Left Arm, Patient Position: Sitting, Cuff Size: Normal)   Pulse 86   Temp 98.3 F (36.8 C)   Resp 16   Ht 5' 4"  (1.626 m)   Wt 143 lb (64.9 kg)   SpO2 100%   BMI 24.55 kg/m     Gen.-alert and oriented x3 in no apparent distress HEENT normal for age Lungs no rhonchi or wheezing Cardiovascular regular rhythm no murmurs carotid pulses 3+ palpable no bruits audible Abdomen soft nontender no palpable masses Musculoskeletal free of  major deformities Skin clear -no rashes Neurologic normal Lower extremities 3+ femoral and dorsalis pedis pulses palpable bilaterally with no edema Right leg with isolated 4 mm varicose vein which is thrombosed and the midcalf over the great saphenous system. There are a few spider veins bilaterally in the medial calf areas both right and left lower extremities. No hyperpigmentation or ulceration noted. No large bulging varicosities noted.  Today I performed a bedside SonoSite ultrasound exam which revealed a normal size great saphenous vein on the right. Also visualize the thrombosed varix just beneath the skin in the appropriate area of the mid calf.       Assessment:     Isolated thrombosed small varicose vein midcalf on the right with no evidence of gross reflux right great saphenous vein    Plan:     Have recommended no treatment for this or the scant spider veins which are present in both lower extremities Exline every assured the patient that  this is not a dangerous situation at the discomfort should resolve and that the darkness in the skin should improve with time Return to see me on a when necessary basis

## 2016-08-22 NOTE — Telephone Encounter (Signed)
Pt in ED over the weekend  Needs APP visit   JMP    ----- Message -----  From: Leanna Battles, RN  Sent: 08/21/2016  4:41 PM  To: Jerene Bears, MD   Pt has been schedule with Amy for 08/30/16 at 8:30 am.  She has been notified

## 2016-08-30 ENCOUNTER — Ambulatory Visit: Payer: BLUE CROSS/BLUE SHIELD | Admitting: Physician Assistant

## 2016-09-07 ENCOUNTER — Telehealth: Payer: Self-pay | Admitting: Internal Medicine

## 2016-09-07 MED ORDER — SUCRALFATE 1 G PO TABS: 1.0000 g | ORAL_TABLET | Freq: Three times a day (TID) | ORAL | 0 refills | Status: DC

## 2016-09-07 NOTE — Telephone Encounter (Signed)
Rx sent 

## 2016-09-08 ENCOUNTER — Ambulatory Visit: Payer: BLUE CROSS/BLUE SHIELD | Admitting: Nurse Practitioner

## 2016-09-09 ENCOUNTER — Telehealth: Payer: Self-pay | Admitting: Internal Medicine

## 2016-09-09 ENCOUNTER — Other Ambulatory Visit: Payer: Self-pay | Admitting: Internal Medicine

## 2016-09-09 MED ORDER — ESOMEPRAZOLE MAGNESIUM 40 MG PO PACK: 40.0000 mg | PACK | Freq: Two times a day (BID) | ORAL | 3 refills | Status: DC

## 2016-09-09 NOTE — Telephone Encounter (Signed)
Spoke with pt and let her know that she can try taking Nexium 86m BID and see if that helps. New script sent to pharmacy.

## 2016-09-15 ENCOUNTER — Encounter: Payer: Self-pay | Admitting: Nurse Practitioner

## 2016-09-15 ENCOUNTER — Ambulatory Visit (INDEPENDENT_AMBULATORY_CARE_PROVIDER_SITE_OTHER): Payer: BLUE CROSS/BLUE SHIELD | Admitting: Nurse Practitioner

## 2016-09-15 VITALS — BP 120/80 | HR 92 | Ht 62.75 in | Wt 144.8 lb

## 2016-09-15 DIAGNOSIS — K219 Gastro-esophageal reflux disease without esophagitis: Secondary | ICD-10-CM

## 2016-09-15 DIAGNOSIS — Z23 Encounter for immunization: Secondary | ICD-10-CM | POA: Diagnosis not present

## 2016-09-15 DIAGNOSIS — K5000 Crohn's disease of small intestine without complications: Secondary | ICD-10-CM | POA: Diagnosis not present

## 2016-09-15 NOTE — Progress Notes (Addendum)
HPI: Patient is a 51 year old female followed by Dr. [name] for ileocolonic Crohn's diagnosed in 2015  She transferred care to [place] in September 2015 but before that had been followed in [place]. She had been tried on Humira but could not tolerate it secondary to flulike symptoms . When patient came to [place] in October 2017 she had been seen in the ER at [facility] for abdominal pain and vomiting. 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 For further evaluation patient underwent EGD with biopsies and colonoscopy with biopsies by Dr. [name] in October 2017.  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 EGD showed a nonbleeding cratered gastric ulcer at the pylorus with was surrounding inflammation causing significant narrowing of the pyloric channel. Biopsies compatible with chronic inactive inflammation and gastritis without H. pylori. It was not clear if the ulcer was related to Crohn's disease.  Follow-up upper endoscopy with biopsies in January of this year showed a persistent gastric stenosis the pylorus precluding passage of adult endoscope. Biopsies revealed reactive gastropathy/chronic gastritis without H. Pylori. No dysplasia was found. We started her on Remicade but several days after the first dose she developed diffuse burning and a rash on her arms and legs. He was seen by dermatology who felt her symptoms were Remicade related.  Past Medical History:  Diagnosis Date  . Anxiety   . Crohn's disease of ileum ([place]) [place] 2014  . Depression   . Diverticulosis   . GERD (gastroesophageal reflux disease)   . Hiatal hernia   . Insomnia   . Malignant melanoma ([place])   . Schatzki's ring   . SVD (spontaneous vaginal delivery)    x 2    Patient's surgical history, family medical history, social history, medications and allergies  were all reviewed in Epic    Physical Exam: BP 120/80   Pulse 92   Ht 5' 2.75" (1.594 m)   Wt 144 lb 12.8 oz (65.7 kg)   BMI 25.85 kg/m   GENERAL: well developed white female in NAD PSYCH: :Pleasant, cooperative, normal affect EENT:  conjunctiva pink, mucous membranes moist, neck supple without masses CARDIAC:  RRR, no murmur heard, no peripheral edema PULM: Normal respiratory effort, lungs CTA bilaterally, no wheezing ABDOMEN:  soft, nontender, nondistended, no obvious masses, no hepatomegaly,  normal bowel sounds SKIN:  turgor, no lesions seen Musculoskeletal:  Normal muscle tone,normal strength NEURO: Alert and oriented x 3, no focal neurologic deficits    ASSESSMENT and PLAN:  50. 51 year old female recently seen in the emergency department for reflux symptoms. She is feeling better with Carafate and twice a day PPI.  He has not pyloric channel stenosis from previous ulcer. She has not had any significant nausea or vomiting suggesting obstruction.  -Continue Carafate for now -Continue twice a day PPI for now -If nausea or vomiting, patient may need EGD with dilation of known stricture  2. 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 Treatment has been difficult . She did not tolerate Humira or Remicade. She was recently started on Imuran, did fine for the first week but after increasing the dose she is developed body aches and terrible fatigue. Imuran patient has been reluctant to try other Biologics . She currently feels well without abdominal pain, bowel changes, vomiting, or arthralgias.. Will need to see Dr. [name] so they can continue discussions  about Crohn's management. .  3. Melanoma, recently removed from abdomen    Tye Savoy , NP 09/15/2016, 9:54 AM   Addendum: Reviewed and agree with management. Pyrtle, Lajuan Lines, MD

## 2016-09-15 NOTE — Patient Instructions (Addendum)
You have been given your 3rd (final) hepatitis A/B injection today.  If you are age 51 or older, your body mass index should be between 23-30. Your Body mass index is 25.85 kg/m. If this is out of the aforementioned range listed, please consider follow up with your Primary Care Provider.  If you are age 85 or younger, your body mass index should be between 19-25. Your Body mass index is 25.85 kg/m. If this is out of the aformentioned range listed, please consider follow up with your Primary Care Provider.   Continue Carafate; titrate as needed.  Take Nexium twice daily.  Call if symptoms worsen.  Follow up with Dr. [name] on 7/18/ 2018 at 900 am.  Thank you for choosing me and [facility].  Tye Savoy, NP

## 2016-10-03 ENCOUNTER — Telehealth: Payer: Self-pay | Admitting: Nurse Practitioner

## 2016-10-03 MED ORDER — SUCRALFATE 1 G PO TABS: 1.0000 g | ORAL_TABLET | Freq: Three times a day (TID) | ORAL | 0 refills | Status: DC

## 2016-10-03 NOTE — Telephone Encounter (Signed)
Medication refilled for #120.

## 2016-10-10 ENCOUNTER — Telehealth: Payer: Self-pay | Admitting: Internal Medicine

## 2016-10-10 NOTE — Telephone Encounter (Signed)
Pt states she was constipated for a couple of days and with the pushing she did to have the BM she is now having anal spasms. Pt states they are very uncomfortable, reports they are coming and going, not constant. Pt wants to know what she can do for the spasms. Please advise.

## 2016-10-11 ENCOUNTER — Other Ambulatory Visit: Payer: Self-pay

## 2016-10-11 MED ORDER — DILTIAZEM GEL 2 %: CUTANEOUS | 0 refills | Status: DC

## 2016-10-11 NOTE — Telephone Encounter (Signed)
Left message for pt to call back.  Spoke with pt and she is aware, script sent to pharmacy for pt.

## 2016-10-11 NOTE — Telephone Encounter (Signed)
Would recommend MiraLAX 17 g daily or Benefiber 1-2 tablespoons daily if constipation is occurring more than rarely Can try diltiazem gel 2% twice a day to 3 times a day for anal spasm Also applying a warm washcloth externally to the anus with vigorous massage has been shown to improve anal spasm as well

## 2016-10-24 ENCOUNTER — Telehealth: Payer: Self-pay | Admitting: Internal Medicine

## 2016-10-24 MED ORDER — GI COCKTAIL ~~LOC~~: 30.0000 mL | Freq: Two times a day (BID) | ORAL | 0 refills | Status: DC

## 2016-10-24 NOTE — Telephone Encounter (Signed)
Patient is c/o esophogeal burning despite Nexium BID and carafate QID.  She is requesting GI cocktail.  She has had this in the ED and it helped.  Dr. Henrene Pastor please review and advise as DOD.

## 2016-10-24 NOTE — Telephone Encounter (Signed)
GI cocktail okay

## 2016-10-24 NOTE — Telephone Encounter (Signed)
Patient notified  10 day supply sent

## 2016-10-28 ENCOUNTER — Telehealth: Payer: Self-pay | Admitting: Internal Medicine

## 2016-10-28 NOTE — Telephone Encounter (Signed)
Pt called earlier in the week with pain/spasms in her esophagus. GI cocktail with lidocaine and mylanta was called in for pt. Pt states what she has gotten in the past had donstal in it. States what was sent in did not have this in it and it is not helping. Please advise.

## 2016-10-28 NOTE — Telephone Encounter (Signed)
Script called into pharmacy and pt aware.

## 2016-10-28 NOTE — Telephone Encounter (Signed)
Can provide Donnatal in addition to the GI cocktail which consisted previously of lidocaine and Mylanta Dose would be solution, 5 mL's every 6-8 hours when necessary (she should be aware the Donnatal solution contains alcohol)

## 2016-10-31 ENCOUNTER — Telehealth: Payer: Self-pay | Admitting: Internal Medicine

## 2016-10-31 NOTE — Telephone Encounter (Signed)
Left message for pt to call back  °

## 2016-11-01 NOTE — Telephone Encounter (Signed)
Pt called to let us know that her insurance company requires a prior auth for the donatol and states it was sent over yesterday. Dottie just wanted to let you know.

## 2016-11-02 NOTE — Telephone Encounter (Signed)
[email or web address] prior authorization has been attempted. Apparent NDC issue. I will contact pharmacy help desk tomorrow at [phone]

## 2016-11-03 NOTE — Telephone Encounter (Signed)
Per Azebe at patient's insurance [phone]), donnatal NDC is not covered by FDA. I questioned how a medication not covered by the FDA would be sold by pharmacies but this was unable to be explained to me. I will attempt to appeal this.

## 2016-11-03 NOTE — Telephone Encounter (Signed)
Dr [name]- Patient's insurance refuses to pay for donnatal. Do you have any other suggestions for substitutions?

## 2016-11-04 NOTE — Telephone Encounter (Signed)
Left voicemail for patient to call back. 

## 2016-11-04 NOTE — Telephone Encounter (Signed)
Would inform patient of the Donnatal denial by her insurance There really is not a substitute medication for Donnatal Would need to buy out of pocket for this specific med, she can check goodrx.com for local prices The hyoscyamine is available (part of donnatal) and can be tried 0.125 mg, 1-2 every 6 h prn in additional to the PPI and GI cocktail

## 2016-11-07 MED ORDER — HYOSCYAMINE SULFATE 0.125 MG SL SUBL: SUBLINGUAL_TABLET | SUBLINGUAL | 2 refills | Status: DC

## 2016-11-07 NOTE — Addendum Note (Signed)
Addended by: Larina Bras on: 11/07/2016 12:35 PM   Modules accepted: Orders

## 2016-11-07 NOTE — Telephone Encounter (Signed)
I have advised patient that insurance will not cover the donnatal. She is agreeable to taking hyoscyamine. Rx sent to pharmacy.

## 2016-11-09 ENCOUNTER — Encounter: Payer: Self-pay | Admitting: Internal Medicine

## 2016-11-09 ENCOUNTER — Ambulatory Visit (INDEPENDENT_AMBULATORY_CARE_PROVIDER_SITE_OTHER): Payer: BLUE CROSS/BLUE SHIELD | Admitting: Internal Medicine

## 2016-11-09 VITALS — BP 118/74 | HR 68 | Ht 62.75 in | Wt 147.0 lb

## 2016-11-09 DIAGNOSIS — K311 Adult hypertrophic pyloric stenosis: Secondary | ICD-10-CM | POA: Diagnosis not present

## 2016-11-09 DIAGNOSIS — R1031 Right lower quadrant pain: Secondary | ICD-10-CM | POA: Diagnosis not present

## 2016-11-09 DIAGNOSIS — R1013 Epigastric pain: Secondary | ICD-10-CM

## 2016-11-09 DIAGNOSIS — K50019 Crohn's disease of small intestine with unspecified complications: Secondary | ICD-10-CM

## 2016-11-09 MED ORDER — SUCRALFATE 1 G PO TABS: 1.0000 g | ORAL_TABLET | Freq: Three times a day (TID) | ORAL | 0 refills | Status: DC

## 2016-11-09 MED ORDER — ESOMEPRAZOLE MAGNESIUM 40 MG PO CPDR: 40.0000 mg | DELAYED_RELEASE_CAPSULE | Freq: Two times a day (BID) | ORAL | 5 refills | Status: DC

## 2016-11-09 NOTE — Patient Instructions (Addendum)
We have sent the following medications to your pharmacy for you to pick up at your convenience: Nexium   We have scheduled an appointment with Dr. Drue Flirt at [facility] for August 1st and 9:00am.  They will send you an appointment confirmation letter with directions and a map.  Continue with your Levsin and Carafate, as needed.    If you are age 51 or older, your body mass index should be between 23-30. Your Body mass index is 26.25 kg/m. If this is out of the aforementioned range listed, please consider follow up with your Primary Care Provider.  If you are age 64 or younger, your body mass index should be between 19-25. Your Body mass index is 26.25 kg/m. If this is out of the aformentioned range listed, please consider follow up with your Primary Care Provider.

## 2016-11-09 NOTE — Progress Notes (Signed)
Subjective:    Patient ID: Renee Atkinson, female    DOB: Jun 30, 1965, 51 y.o.   MRN: [id number]  HPI Renee Atkinson is a 51 year old female with past medical history of Crohn's ileitis diagnosed in 2015, pyloric stenosis with history of peptic ulcer disease (possibly Crohn's related), GERD, and history of melanoma who is here for follow-up. She was last seen in the office on 09/15/2016 by Tye Savoy, NP and by me on 08/01/2016.  Unfortunately she has had a very difficult time tolerating Crohn's therapy. She had a CT scan performed on 08/21/2016 which showed chronic Crohn's involving the distal ileum with wall thickening and segmental dilatation in the midportion of the segment dilated up to 5 cm suggestive of stricture. She also had a upper GI series which showed prepyloric high-grade stricture along with irregularity in the TI.  Prior to establishing care with me she had tried Humira which caused fever, nausea, vomiting and flulike symptoms. I tried her on Remicade but after her first infusion she developed diffuse burning rash on her arms and legs. Dermatology could not exclude this was drug related. We then tried her on azathioprine which she tolerated at low dose. When we increased to the weight-based dose she developed diffuse body aches and extreme fatigue. The medication was then stopped. She has intermittently taken steroids but none recently. She is currently on no maintenance IBD medicine.  She is taking Nexium 40 mg twice a day and when needed Levsin and sucralfate.  She has had intermittent issues with off and on reflux epigastric abdominal pain. This is not associated with nausea and vomiting. Despite her pyloric stenosis she denies that food worsens the symptom. She developed one such episode [place]. Urgent care gave her GI cocktail plus Donnatal which she states helped tremendously. We attempted to prescribe Donnatal but it is no longer covered by her  insurance. [place]gent care [place] the beach also recommended doubling Nexium to 80 mg twice a day which she did for for 5 days. These symptoms have since resolved.  Today she reports no issues with heartburn, nausea, vomiting or epigastric pain. She does have ongoing right-sided abdominal pain which is worse with certain dietary triggers such as coffee. Stress also worsens this pain. Stools can be loose but nonbloody.   Review of Systems As per HPI, otherwise negative  Current Medications, Allergies, Past Medical History, Past Surgical History, Family History and Social History were reviewed in [employer] record.     Objective:   Physical Exam BP 118/74 (BP Location: Left Arm, Patient Position: Sitting, Cuff Size: Normal)   Pulse 68   Ht 5' 2.75" (1.594 m)   Wt 147 lb (66.7 kg)   SpO2 98%   BMI 26.25 kg/m  Constitutional: Well-developed and well-nourished. No distress. HEENT: Normocephalic and atraumatic.   Conjunctivae are normal.  No scleral icterus. Neck: Neck supple. Trachea midline. Cardiovascular: Normal rate, regular rhythm and intact distal pulses. No M/R/G Pulmonary/chest: Effort normal and breath sounds normal. No wheezing, rales or rhonchi. Abdominal: Soft, tender in right mid and lower abd, well healed incision RLQ, nondistended, epigastric tenderness,  Bowel sounds active throughout. There are no masses palpable. No hepatosplenomegaly. Extremities: no clubbing, cyanosis, or edema Neurological: Alert and oriented to person place and time. Skin: Skin is warm and dry. Psychiatric: Normal mood and affect. Behavior is normal.  CBC    Component Value Date/Time   WBC 12.1 (H) 08/21/2016 1356  RBC 5.29 (H) 08/21/2016 1356   HGB 16.2 (H) 08/21/2016 1356   HCT 48.0 (H) 08/21/2016 1356   PLT 387 08/21/2016 1356   MCV 90.7 08/21/2016 1356   MCH 30.6 08/21/2016 1356   MCHC 33.8 08/21/2016 1356   RDW 15.4 08/21/2016 1356   LYMPHSABS 3.3 06/03/2016 0959    MONOABS 0.5 06/03/2016 0959   EOSABS 0.1 06/03/2016 0959   BASOSABS 0.1 06/03/2016 0959   CMP     Component Value Date/Time   NA 139 08/21/2016 1356   K 3.8 08/21/2016 1356   CL 103 08/21/2016 1356   CO2 28 08/21/2016 1356   GLUCOSE 89 08/21/2016 1356   BUN 23 (H) 08/21/2016 1356   CREATININE 1.01 (H) 08/21/2016 1356   CALCIUM 9.6 08/21/2016 1356   PROT 7.7 08/21/2016 1356   ALBUMIN 4.1 08/21/2016 1356   AST 20 08/21/2016 1356   ALT 12 (L) 08/21/2016 1356   ALKPHOS 57 08/21/2016 1356   BILITOT 0.9 08/21/2016 1356   GFRNONAA >60 08/21/2016 1356   GFRAA >60 08/21/2016 1356   UPPER GI SERIES WITH SMALL BOWEL FOLLOW-THROUGH   FLUOROSCOPY TIME:  Fluoroscopy Time:  4 minutes 53 seconds   Radiation Exposure Index (if provided by the fluoroscopic device): 82 mGy   Number of Acquired Spot Images: 21   TECHNIQUE: Combined double contrast and single contrast upper GI series using effervescent crystals, thick barium, and thin barium. Subsequently, serial images of the small bowel were obtained including spot views of the terminal ileum.   COMPARISON:  CT abdomen pelvis 11/30/2015.   FINDINGS: Scout view of the abdomen shows a fair amount of stool in the colon. Surgical clips in the right upper quadrant.   Esophageal motility is normal. No esophageal fold thickening, stricture or obstruction. Stomach is largely within normal limits with the exception of a high-grade stricture in the peripyloric stomach. Duodenal bulb and small bowel fold pattern are normal. Mild irregularity of the terminal ileum. The ileocecal junction failed to distend, despite changes in patient position.   IMPRESSION: 1. Peripyloric high-grade stricture, as on recent endoscopy. 2. Slight irregularity of the terminal ileum, consistent with the given history of Crohn disease. 3. Failure to adequately distend the ileocecal junction. Difficult to exclude a stricture.     Electronically Signed   By:  Lorin Picket M.D.   On: 05/16/2016 11:01   CT ABDOMEN AND PELVIS WITH CONTRAST   TECHNIQUE: Multidetector CT imaging of the abdomen and pelvis was performed using the standard protocol following bolus administration of intravenous contrast.   CONTRAST:  <See Chart> ISOVUE-300 IOPAMIDOL (ISOVUE-300) INJECTION 61%   COMPARISON:  11/30/2015   FINDINGS: Lower chest: Normal   Hepatobiliary: Liver parenchyma is normal. Previous cholecystectomy.   Pancreas: Normal   Spleen: Normal   Adrenals/Urinary Tract: Adrenal glands are normal. Urinary tract contrast present initially. Therefore cannot evaluate for renal calculi. No sign of mass, cyst or obstruction. No bladder abnormality.   Stomach/Bowel: No evidence of bowel obstruction. There is a long segment of mid to distal ileum which shows wall thickening and segmental dilatation, consistent with Crohn's involvement. The terminal ileum itself shows only mild wall thickening. No evidence of abscess, free fluid or air.   Vascular/Lymphatic: Normal   Reproductive: Previous hysterectomy.  No pelvic mass.   Other: No free fluid or air.   Musculoskeletal: Negative   IMPRESSION: Chronic Crohn's involvement of the mid through distal ileum with wall thickening. Segmental dilatation in the midportion of that segment  with a diameter up to 5 cm. This could indicate an inflammatory stricture at the transition zone.     Electronically Signed   By: Nelson Chimes M.D.   On: 08/21/2016 15:51       Assessment & Plan:  51 year old female with past medical history of Crohn's ileitis diagnosed in 2015, pyloric stenosis with history of peptic ulcer disease (possibly Crohn's related), GERD, and history of melanoma who is here for follow-up.  1. Crohn's ileitis/pyloric stenosis -- unfortunately she has not tolerated IBD/Crohn's therapy. She did not tolerate azathioprine nor Remicade or Humira. Unfortunately we are running out of options to  adequately manage her disease medically. Cimzia would be an option as the only remaining TNF inhibitor that she has not tried. Stelara would also be an option. Given the location of her Crohn's disease I do not think [facility] is a viable option for her. We had a long discussion about this today and I have recommended getting a surgical opinion to consider ileocecectomy to remove the active Crohn's disease in the ileum along with antrectomy versus pyloroplasty. To this point she has not had true gastric outlet obstructive symptoms however some of her issues with epigastric pain very likely relate to this stricture. I recommended that she continue Nexium 40 mg twice a day before meals. She can use Levsin on an as-needed basis along with Carafate on an as-needed basis. --She is being referred to Dr. Drue Flirt at [facility] and will see me back in 2-3 months, sooner if needed  2. Melanoma -- status post surgical excision. She is advised to follow closely with dermatology.  40 minutes spent with the patient today. Greater than 50% was spent in counseling and coordination of care with the patient

## 2016-11-10 ENCOUNTER — Telehealth: Payer: Self-pay | Admitting: Internal Medicine

## 2016-11-10 NOTE — Telephone Encounter (Signed)
records faxed again to [phone].  Marie notified.

## 2016-11-21 ENCOUNTER — Telehealth: Payer: Self-pay | Admitting: Internal Medicine

## 2016-11-21 MED ORDER — ONDANSETRON HCL 4 MG PO TABS: ORAL_TABLET | ORAL | 0 refills | Status: DC

## 2016-11-21 MED ORDER — PREDNISONE 10 MG PO TABS: ORAL_TABLET | ORAL | 0 refills | Status: DC

## 2016-11-21 NOTE — Telephone Encounter (Signed)
Spoke with pts husband and she would like to try the prednisone and zofran. Scripts sent to pharmacy. Pt knows to go to er if worsens or meds do not help.

## 2016-11-21 NOTE — Telephone Encounter (Signed)
[facility] ER is a good idea if she remains very sick, particularly if she is unable to keep down fluids and stay hydrated. If she feels better we can start prednisone 40 mg daily x 10 days, then decrease by 5 mg every 7 days until off. She can use zofran 4-8 mg every 8 hrs as needed for nausea Given she will soon be seen by Dr. Drue Flirt and will likely need surgery (at least eventually), if she does go to the ER, [facility] is a good idea for her

## 2016-11-21 NOTE — Telephone Encounter (Signed)
Pts husband states wife is having a flare they think. States it started Saturday about 2 am. She is having a lot of pain in her stomach and on the right side of her abdomen.Since Saturday she has vomited about 4-5 times, she is having diarrhea, no blood in the stool. She is able to eat small amounts of food and drink liquids. Pt has appt with surgeon at [facility] on 11/23/16. Asking what she can do, asked if she should go to the ER at [facility]. Please advise.

## 2016-11-23 DIAGNOSIS — K5000 Crohn's disease of small intestine without complications: Secondary | ICD-10-CM | POA: Insufficient documentation

## 2016-12-12 DIAGNOSIS — K219 Gastro-esophageal reflux disease without esophagitis: Secondary | ICD-10-CM | POA: Insufficient documentation

## 2016-12-14 ENCOUNTER — Telehealth: Payer: Self-pay | Admitting: Internal Medicine

## 2016-12-14 MED ORDER — RANITIDINE HCL 150 MG PO CAPS: 150.0000 mg | ORAL_CAPSULE | Freq: Two times a day (BID) | ORAL | 3 refills | Status: DC

## 2016-12-14 NOTE — Telephone Encounter (Signed)
Script for Zantac 170m BID sent to pharmacy for pt.

## 2016-12-23 ENCOUNTER — Encounter: Payer: Self-pay | Admitting: *Deleted

## 2016-12-27 DIAGNOSIS — Z8719 Personal history of other diseases of the digestive system: Secondary | ICD-10-CM | POA: Insufficient documentation

## 2017-01-18 ENCOUNTER — Ambulatory Visit: Payer: BLUE CROSS/BLUE SHIELD | Admitting: Internal Medicine

## 2017-02-13 ENCOUNTER — Other Ambulatory Visit: Payer: Self-pay | Admitting: Internal Medicine

## 2017-03-09 HISTORY — PX: LAPAROSCOPIC ILEOCECECTOMY: SHX5898

## 2017-03-22 DIAGNOSIS — K279 Peptic ulcer, site unspecified, unspecified as acute or chronic, without hemorrhage or perforation: Secondary | ICD-10-CM | POA: Insufficient documentation

## 2017-05-10 ENCOUNTER — Telehealth: Payer: Self-pay | Admitting: Internal Medicine

## 2017-05-10 NOTE — Telephone Encounter (Signed)
Dr. [name] have you heard anything from Dr. Drue Flirt? See note below?

## 2017-05-10 NOTE — Telephone Encounter (Signed)
Patient states she had surgery at [facility] on March 09, 2017 and was told by surgeon Dr.Asheburn that she was going to speak with Dr.Pyrtle about putting pt on medication like humira. Pt wanting a call to discuss and see if Dr.Pyrtle has heard from [place] [facility].

## 2017-05-11 NOTE — Telephone Encounter (Signed)
05/18/2017 2:30 PM okay to use for her followup

## 2017-05-11 NOTE — Telephone Encounter (Signed)
Dr. Geronimo Boot notes said medical management of Crohn's disease, but did not mention a specific medication She has previously tried AZA, Remicade and Humira There are other options that we need to consider to try and prevent recurrence of her Crohn's disease, such as Cimzia and/or Stelara We will discuss these at her followup visit

## 2017-05-11 NOTE — Telephone Encounter (Signed)
Spoke with pt and she is aware of Dr. Vena Rua comments and the appt date and time.

## 2017-05-11 NOTE — Telephone Encounter (Signed)
I reviewed her office and surgical notes Also reviewed the EGD reports where she had dilation of pyloric and duodenal strictures

## 2017-05-11 NOTE — Telephone Encounter (Signed)
Pt does not have an OV scheduled and first available is 07/14/17, is that ok?

## 2017-05-11 NOTE — Telephone Encounter (Signed)
Pt states Dr. Drue Flirt mentioned starting her on Humira and she wants to know if Dr. [name] know about this.

## 2017-05-15 ENCOUNTER — Encounter: Payer: Self-pay | Admitting: *Deleted

## 2017-05-18 ENCOUNTER — Ambulatory Visit: Payer: BLUE CROSS/BLUE SHIELD | Admitting: Internal Medicine

## 2017-05-18 ENCOUNTER — Other Ambulatory Visit (INDEPENDENT_AMBULATORY_CARE_PROVIDER_SITE_OTHER): Payer: BLUE CROSS/BLUE SHIELD

## 2017-05-18 ENCOUNTER — Encounter: Payer: Self-pay | Admitting: Internal Medicine

## 2017-05-18 VITALS — BP 116/74 | HR 78 | Ht 64.0 in | Wt 140.0 lb

## 2017-05-18 DIAGNOSIS — K50019 Crohn's disease of small intestine with unspecified complications: Secondary | ICD-10-CM

## 2017-05-18 DIAGNOSIS — Z79899 Other long term (current) drug therapy: Secondary | ICD-10-CM | POA: Diagnosis not present

## 2017-05-18 DIAGNOSIS — K315 Obstruction of duodenum: Secondary | ICD-10-CM

## 2017-05-18 DIAGNOSIS — K311 Adult hypertrophic pyloric stenosis: Secondary | ICD-10-CM

## 2017-05-18 DIAGNOSIS — K279 Peptic ulcer, site unspecified, unspecified as acute or chronic, without hemorrhage or perforation: Secondary | ICD-10-CM

## 2017-05-18 LAB — CBC WITH DIFFERENTIAL/PLATELET
Basophils Absolute: 0.1 10*3/uL (ref 0.0–0.1)
Basophils Relative: 0.6 % (ref 0.0–3.0)
EOS PCT: 2.5 % (ref 0.0–5.0)
Eosinophils Absolute: 0.2 10*3/uL (ref 0.0–0.7)
HCT: 40.1 % (ref 36.0–46.0)
HEMOGLOBIN: 13.2 g/dL (ref 12.0–15.0)
Lymphocytes Relative: 36.5 % (ref 12.0–46.0)
Lymphs Abs: 3 10*3/uL (ref 0.7–4.0)
MCHC: 33 g/dL (ref 30.0–36.0)
MCV: 90.7 fl (ref 78.0–100.0)
MONOS PCT: 4.9 % (ref 3.0–12.0)
Monocytes Absolute: 0.4 10*3/uL (ref 0.1–1.0)
Neutro Abs: 4.6 10*3/uL (ref 1.4–7.7)
Neutrophils Relative %: 55.5 % (ref 43.0–77.0)
Platelets: 264 10*3/uL (ref 150.0–400.0)
RBC: 4.43 Mil/uL (ref 3.87–5.11)
RDW: 15.4 % (ref 11.5–15.5)
WBC: 8.3 10*3/uL (ref 4.0–10.5)

## 2017-05-18 LAB — VITAMIN B12: Vitamin B-12: 547 pg/mL (ref 211–911)

## 2017-05-18 LAB — COMPREHENSIVE METABOLIC PANEL
ALBUMIN: 3.9 g/dL (ref 3.5–5.2)
ALK PHOS: 58 U/L (ref 39–117)
ALT: 25 U/L (ref 0–35)
AST: 22 U/L (ref 0–37)
BUN: 13 mg/dL (ref 6–23)
CO2: 30 mEq/L (ref 19–32)
Calcium: 9.2 mg/dL (ref 8.4–10.5)
Chloride: 104 mEq/L (ref 96–112)
Creatinine, Ser: 0.69 mg/dL (ref 0.40–1.20)
GFR: 95.1 mL/min (ref >60.00)
Glucose, Bld: 94 mg/dL (ref 70–99)
POTASSIUM: 3.4 meq/L — AB (ref 3.5–5.1)
Sodium: 138 mEq/L (ref 135–145)
TOTAL PROTEIN: 6.5 g/dL (ref 6.0–8.3)
Total Bilirubin: 0.4 mg/dL (ref 0.2–1.2)

## 2017-05-18 LAB — HIGH SENSITIVITY CRP: CRP HIGH SENSITIVITY: 2.89 mg/L (ref 0.000–5.000)

## 2017-05-18 MED ORDER — DIPHENOXYLATE-ATROPINE 2.5-0.025 MG PO TABS: 1.0000 | ORAL_TABLET | Freq: Four times a day (QID) | ORAL | 5 refills | Status: DC

## 2017-05-18 MED ORDER — OMEPRAZOLE 40 MG PO CPDR: 40.0000 mg | DELAYED_RELEASE_CAPSULE | Freq: Two times a day (BID) | ORAL | 3 refills | Status: DC

## 2017-05-18 NOTE — Progress Notes (Signed)
Subjective:    Patient ID: Renee Atkinson, female    DOB: 07/19/1965, 52 y.o.   MRN: [id number]  HPI Renee Atkinson is a 52 year old female with a past medical history of stricturing ileal Crohn's disease diagnosed in 2015 now status post ileocecectomy in November 2018 with Dr. Drue Flirt, history of peptic ulcer disease with pyloric stenosis and a duodenal stricture (cannot exclude Crohn's related) status post serial dilation at [facility], history of melanoma who is here for follow-up.  I last saw her on 10/30/2016.  She is here today with her husband.  She had several upper endoscopies with dilation under fluoroscopy of her pyloric channel and proximal duodenal stricture.  She states that these went well and her upper abdominal pain has completely resolved.  She remains on twice daily omeprazole.  She has no trouble with nausea, vomiting or abdominal pain today.  She has avoided NSAIDs.  She tolerated her ileocecectomy well but did have a mild postoperative wound dehiscence and infection treated with antibiotics and packing.  This is now healed.  She is abdominal pain-free.  If she eats sweets or high fat/greasy food or milk she will have urgent loose stools.  She has been using Lomotil 1 or 2 times per day on an as-needed basis.  She has not needed this every day.  With a low-fat diet she will have 1 or 2 bowel movements per day.  No blood in her stool or melena.  She is back to working about 3 days/week.  Dr. Drue Flirt recommended she discuss medical therapy after her surgery.  Recall that she developed a rash with Remicade and thus we discontinued it.  She did try Humira prior to establishing care with me which she stated caused fever, nausea, vomiting and flulike symptoms with each dose.  We tried her on azathioprine which she tolerated at low dose but as the dose was increased she developed diffuse body aches and extreme fatigue.  Review of Systems As per HPI, otherwise negative  Current  Medications, Allergies, Past Medical History, Past Surgical History, Family History and Social History were reviewed in [employer] record.     Objective:   Physical Exam BP 116/74   Pulse 78   Ht 5' 4"  (1.626 m)   Wt 140 lb (63.5 kg)   BMI 24.03 kg/m  Constitutional: Well-developed and well-nourished. No distress. HEENT: Normocephalic and atraumatic. Oropharynx is clear and moist. Conjunctivae are normal.  No scleral icterus. Neck: Neck supple. Trachea midline. Cardiovascular: Normal rate, regular rhythm and intact distal pulses. No M/R/G Pulmonary/chest: Effort normal and breath sounds normal. No wheezing, rales or rhonchi. Abdominal: Soft, well-healed abdominal scar without tenderness, nontender, nondistended. Bowel sounds active throughout. There are no masses palpable. No hepatosplenomegaly. Extremities: no clubbing, cyanosis, or edema Neurological: Alert and oriented to person place and time. Skin: Skin is warm and dry. Psychiatric: Normal mood and affect. Behavior is normal.  CBC    Component Value Date/Time   WBC 8.3 05/18/2017 1541   RBC 4.43 05/18/2017 1541   HGB 13.2 05/18/2017 1541   HCT 40.1 05/18/2017 1541   PLT 264.0 05/18/2017 1541   MCV 90.7 05/18/2017 1541   MCH 30.6 08/21/2016 1356   MCHC 33.0 05/18/2017 1541   RDW 15.4 05/18/2017 1541   LYMPHSABS 3.0 05/18/2017 1541   MONOABS 0.4 05/18/2017 1541   EOSABS 0.2 05/18/2017 1541   BASOSABS 0.1 05/18/2017 1541   CMP     Component Value Date/Time  NA 138 05/18/2017 1541   K 3.4 (L) 05/18/2017 1541   CL 104 05/18/2017 1541   CO2 30 05/18/2017 1541   GLUCOSE 94 05/18/2017 1541   BUN 13 05/18/2017 1541   CREATININE 0.69 05/18/2017 1541   CALCIUM 9.2 05/18/2017 1541   PROT 6.5 05/18/2017 1541   ALBUMIN 3.9 05/18/2017 1541   AST 22 05/18/2017 1541   ALT 25 05/18/2017 1541   ALKPHOS 58 05/18/2017 1541   BILITOT 0.4 05/18/2017 1541   GFRNONAA >60 08/21/2016 1356   GFRAA >60  08/21/2016 1356   hsCRP 2.9    Assessment & Plan:  52 year old female with a past medical history of stricturing ileal Crohn's disease diagnosed in 2015 now status post ileocecectomy in November 2018 with Dr. Drue Flirt, history of peptic ulcer disease with pyloric stenosis and a duodenal stricture (cannot exclude Crohn's related) status post serial dilation at [facility], history of melanoma who is here for follow-up.  1.  Ileal Crohn's complicated by stricture formation/ileocecectomy November 2018 --she returns for follow-up and is doing well and has recovered after her ileal resection.  No evidence of obstructive symptoms.  She is having some intermittent loose stools which are likely exacerbated by her dietary choices.  Lomotil has worked well for her on days when she has loose stools.  I have recommended medical therapy for her Crohn's disease given its aggressive nature in the past with stricture formation.  We discussed how anti-TNF therapy can prevent Crohn's recurrence after surgery, reduce stricture formation, reduce the need for hospitalization and steroids.  She developed a rash with Remicade and flulike symptoms with Humira.  Cimzia would be the only anti-TNF therapy remaining.  Entyvio, in my opinion, is not a great option for her given that it seems to work better for colitis rather than small bowel disease.  Stelara would also be another option, but our data remains best for stricturing disease with anti-TNF therapy.  We again discussed the risks in great detail of Cimzia including the risk of infection (including reactivation of latent TB and underlying viral hepatitis), hepatotoxicity, leukopenia, pancreatitis, nausea, malignancy (specifically lymphoma, skin cancer), demyelinating disease, and even heart failure.  After our discussion she wishes to begin Cimzia therapy.  We will repeat hepatitis B serologies and QuantiFERON gold.  I recommend that we begin Cimzia with standard  induction (400 mg subcu on week 0, 2 and 4, followed by 400 mg every 4 weeks thereafter).  Given her prior reactions to Humira I asked that she monitor for flulike symptoms but also take 650 mg of Tylenol before each injection of Cimzia.  She should continue to avoid NSAIDs  Okay for Lomotil 4 times a day as needed  2.  Peptic and duodenal strictures --H. 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  Dysplasia negative.  Cannot exclude that this is related to her Crohn's disease.  If so Cimzia should help.  Continue twice daily PPI.  No NSAIDs.  May need repeat dilation in the future but now no evidence of abdominal pain or obstructive symptoms.  3.  History of melanoma --I reminded her and strongly encouraged her to follow-up with dermatology regularly given her history of melanoma but also our plan to resume biologic therapy.  She voiced understanding  4.  B12 -- I recommended that we monitor B12 given her ileal resectio-.  I also recommended she begin 1000 mcg daily.  If B12 drops on oral supplementation we will begin IM therapy.

## 2017-05-18 NOTE — Patient Instructions (Signed)
We have sent the following medications to your pharmacy for you to pick up at your convenience: Prilosec 40 mg twice daily before meals Lomotil four times daily as needed  Your physician has requested that you go to the basement for the following lab work before leaving today: Hepatitis B antigen, antibody, core total, CRP, B12, Quant gold  Please purchase the following medications over the counter and take as directed: Vitamin B12 1000 mcg daily  Please refrain from all NSAIDs (ibuprofen, naproxen etc)  We will start the process of getting you on Cimzia.  Please follow up with Dr [name] in 4 months.  If you are age 52 or older, your body mass index should be between 23-30. Your Body mass index is 24.03 kg/m. If this is out of the aforementioned range listed, please consider follow up with your Primary Care Provider.  If you are age 23 or younger, your body mass index should be between 19-25. Your Body mass index is 24.03 kg/m. If this is out of the aformentioned range listed, please consider follow up with your Primary Care Provider.

## 2017-05-19 LAB — HEPATITIS B SURFACE ANTIGEN: HEP B S AG: NONREACTIVE

## 2017-05-19 LAB — HEPATITIS B SURFACE ANTIBODY,QUALITATIVE: Hep B S Ab: NONREACTIVE

## 2017-05-19 LAB — HEPATITIS B CORE ANTIBODY, TOTAL: HEP B C TOTAL AB: NONREACTIVE

## 2017-05-20 LAB — QUANTIFERON-TB GOLD PLUS
Mitogen-NIL: >10 IU/mL
NIL: 0.03 [IU]/mL
QUANTIFERON-TB GOLD PLUS: NEGATIVE
TB1-NIL: 0.01 IU/mL
TB2-NIL: 0.01 [IU]/mL

## 2017-05-29 ENCOUNTER — Telehealth: Payer: Self-pay

## 2017-05-29 ENCOUNTER — Other Ambulatory Visit: Payer: Self-pay

## 2017-05-29 DIAGNOSIS — K5090 Crohn's disease, unspecified, without complications: Secondary | ICD-10-CM

## 2017-05-29 NOTE — Telephone Encounter (Signed)
Pts insurance has denied coverage for cimzia. The first preferred product for her health plan is humira and the second is Stelara. States that if they tried humira and it didn't work well the next product should be Stelara. Please advise.

## 2017-05-29 NOTE — Telephone Encounter (Signed)
Please let patient know that Cimzia was denied by her insurance company despite her Remicade and Humira allergies/intolerance  Stelara is acceptable for her Crohn's disease and can be started with standard induction We discussed Biologics at the time of her last visit and Stelara has similar safety profile as well as with risk profile Please let me know if she wishes to discuss this further prior to initiation

## 2017-05-29 NOTE — Telephone Encounter (Signed)
Left message for pt to call back  °

## 2017-05-29 NOTE — Telephone Encounter (Signed)
Spoke with pt and she is aware. Pt would like to receive Stelara infusion at hospital. Referral entered in epic.

## 2017-06-07 ENCOUNTER — Telehealth: Payer: Self-pay | Admitting: Internal Medicine

## 2017-06-07 NOTE — Telephone Encounter (Signed)
Explained to pt that we are waiting to hear back from her insurance company and that we will let her know when we hear back from them.

## 2017-06-21 ENCOUNTER — Telehealth: Payer: Self-pay | Admitting: Internal Medicine

## 2017-06-21 NOTE — Telephone Encounter (Signed)
Spoke with pt and she is aware.

## 2017-06-21 NOTE — Telephone Encounter (Signed)
Pt has not started Stelara yet, hope to get insurance approval by the end of this week. 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 Pt wants to know what Dr. Vena Rua thoughts are regarding this. Please advise.

## 2017-06-21 NOTE — Telephone Encounter (Signed)
I do not have specific knowledge regarding the benefits nor the risks of CBD oil With her ileal Crohn's disease, there is no evidence, that I am aware of, where CBD O would be effective Given her improvement after surgery and lack of current symptoms, I do not think this would be beneficial at this time Stelara continues to be recommended given her history of ileal strictures requiring surgery

## 2017-06-23 ENCOUNTER — Telehealth: Payer: Self-pay

## 2017-06-23 NOTE — Telephone Encounter (Signed)
Pt scheduled for Stelara infusion at [facility] 07/18/17@7 :30am. Pts husband aware of appt. Dr. [name] do you want pt to receive Stelara 342m? Please advise.

## 2017-06-25 NOTE — Telephone Encounter (Signed)
Yes, dose is correct based on weight between 55-85 kg

## 2017-06-26 ENCOUNTER — Other Ambulatory Visit: Payer: Self-pay

## 2017-06-26 DIAGNOSIS — K50918 Crohn's disease, unspecified, with other complication: Secondary | ICD-10-CM

## 2017-06-26 NOTE — Telephone Encounter (Signed)
Orders entered in epic.

## 2017-07-18 ENCOUNTER — Encounter (HOSPITAL_COMMUNITY)
Admission: RE | Admit: 2017-07-18 | Discharge: 2017-07-18 | Disposition: A | Discharge status: Home / Self Care | Payer: BLUE CROSS/BLUE SHIELD | Source: Ambulatory Visit | Attending: Internal Medicine | Admitting: Internal Medicine

## 2017-07-18 ENCOUNTER — Encounter (HOSPITAL_COMMUNITY): Payer: Self-pay

## 2017-07-18 DIAGNOSIS — K50918 Crohn's disease, unspecified, with other complication: Secondary | ICD-10-CM

## 2017-07-18 MED ORDER — SODIUM CHLORIDE 0.9 % IV SOLN
Freq: Once | INTRAVENOUS | Status: AC
  Administered 2017-07-18: 08:00:00 via INTRAVENOUS

## 2017-07-18 MED ORDER — USTEKINUMAB 130 MG/26ML IV SOLN
390.0000 mg | Freq: Once | INTRAVENOUS | Status: AC
  Administered 2017-07-18: 390 mg via INTRAVENOUS
  Filled 2017-07-18: qty 78

## 2017-07-18 NOTE — Discharge Instructions (Signed)
Ustekinumab injection What is this medicine? USTEKINUMAB ([place] te KIN ue mab) is used to treat plaque psoriasis and psoriatic arthritis. This medicine is also used to treat Crohn's disease. It is not a cure. This medicine may be used for other purposes; ask your health care provider or pharmacist if you have questions. COMMON BRAND NAME(S): Stelara What should I tell my health care provider before I take this medicine? They need to know if you have any of these conditions: -cancer -diabetes -immune system problems -infection (especially a virus infection such as chickenpox, cold sores, or herpes) or history of infections -receiving or have received allergy shots -recently received or scheduled to receive a vaccine -tuberculosis, a positive skin test for tuberculosis, or have recently been in close contact with someone who has tuberculosis -an unusual reaction to ustekinumab, latex, other medicines, foods, dyes, or preservatives -pregnant or trying to get pregnant -breast-feeding How should I use this medicine? This medicine is for injection under the skin or infusion into a vein. It is usually given by a health care professional in a hospital or clinic setting. If you get this medicine at home, you will be taught how to prepare and give this medicine. Use exactly as directed. Take your medicine at regular intervals. Do not take your medicine more often than directed. It is important that you put your used needles and syringes in a special sharps container. Do not put them in a trash can. If you do not have a sharps container, call your pharmacist or healthcare provider to get one. A special MedGuide will be given to you by the pharmacist with each prescription and refill. Be sure to read this information carefully each time. Talk to your pediatrician regarding the use of this medicine in children. While this drug may be prescribed for children as young as 12 years for selected conditions,  precautions do apply. Overdosage: If you think you have taken too much of this medicine contact a poison control center or emergency room at once. NOTE: This medicine is only for you. Do not share this medicine with others. What if I miss a dose? If you miss a dose, take it as soon as you can. If it is almost time for your next dose, take only that dose. Do not take double or extra doses. What may interact with this medicine? Do not take this medicine with any of the following medications: -live virus vaccines This medicine may also interact with the following medications: -cyclosporine -immunosuppressives -vaccines -warfarin This list may not describe all possible interactions. Give your health care provider a list of all the medicines, herbs, non-prescription drugs, or dietary supplements you use. Also tell them if you smoke, drink alcohol, or use illegal drugs. Some items may interact with your medicine. What should I watch for while using this medicine? Your condition will be monitored carefully while you are receiving this medicine. Tell your doctor or healthcare professional if your symptoms do not start to get better or if they get worse. You will be tested for tuberculosis (TB) before you start this medicine. If your doctor prescribes any medicine for TB, you should start taking the TB medicine before starting this medicine. Make sure to finish the full course of TB medicine. Call your doctor or health care professional if you get a cold or other infection while receiving this medicine. Do not treat yourself. This medicine may decrease your body's ability to fight infection. Talk to your doctor about your risk of  cancer. You may be more at risk for certain types of cancers if you take this medicine. What side effects may I notice from receiving this medicine? Side effects that you should report to your doctor or health care professional as soon as possible: -allergic reactions like skin  rash, itching or hives, swelling of the face, lips, or tongue -breathing problems -changes in vision -confusion -seizures -signs and symptoms of infection like fever or chills; cough; sore throat; pain or trouble passing urine -swollen lymph nodes in the neck, underarm, or groin areas -unexplained weight loss -unusually weak or tired -vomiting Side effects that usually do not require medical attention (report to your doctor or health care professional if they continue or are bothersome): -headache -nausea -redness, itching, swelling, or bruising at site where injected This list may not describe all possible side effects. Call your doctor for medical advice about side effects. You may report side effects to FDA at 1-800-FDA-1088. Where should I keep my medicine? Keep out of the reach of children. If you are using this medicine at home, you will be instructed on how to store this medicine. Store the prefilled syringes in a refrigerator between 2 to 8 degrees C (36 to 46 degrees F). Keep in the original carton. Protect from light. Do not freeze. Do not shake. Throw away any unused medicine after the expiration date on the label. NOTE: This sheet is a summary. It may not cover all possible information. If you have questions about this medicine, talk to your doctor, pharmacist, or health care provider.  2018 Elsevier/Gold Standard (2016-02-09 09:12:47)

## 2017-07-24 ENCOUNTER — Other Ambulatory Visit: Payer: Self-pay

## 2017-07-24 MED ORDER — USTEKINUMAB 90 MG/ML ~~LOC~~ SOSY: 90.0000 mg | PREFILLED_SYRINGE | SUBCUTANEOUS | 8 refills | Status: DC

## 2017-07-25 IMAGING — DX DG UGI W/ SMALL BOWEL
3 series · 3 of 3 positions shown · non-contrast
Comparison: CT abdomen pelvis 11/30/2015.

CLINICAL DATA: Crohn disease with distal gastric stricture.

EXAM:
UPPER GI SERIES WITH SMALL BOWEL FOLLOW-THROUGH
FLUOROSCOPY TIME:  Fluoroscopy Time:  4 minutes 53 seconds
Radiation Exposure Index (if provided by the fluoroscopic device):
82 mGy
Number of Acquired Spot Images: 21
TECHNIQUE: Combined double contrast and single contrast upper GI series using
effervescent crystals, thick barium, and thin barium. Subsequently,
serial images of the small bowel were obtained including spot views
of the terminal ileum.

[abdomen kub (1 of 3)]
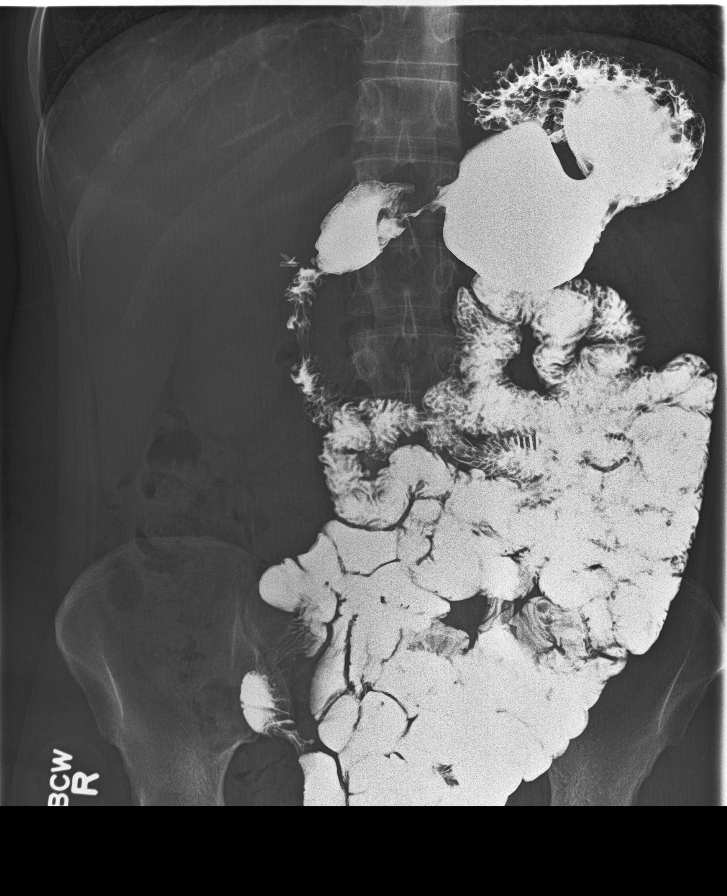

[abdomen kub (2 of 3)]
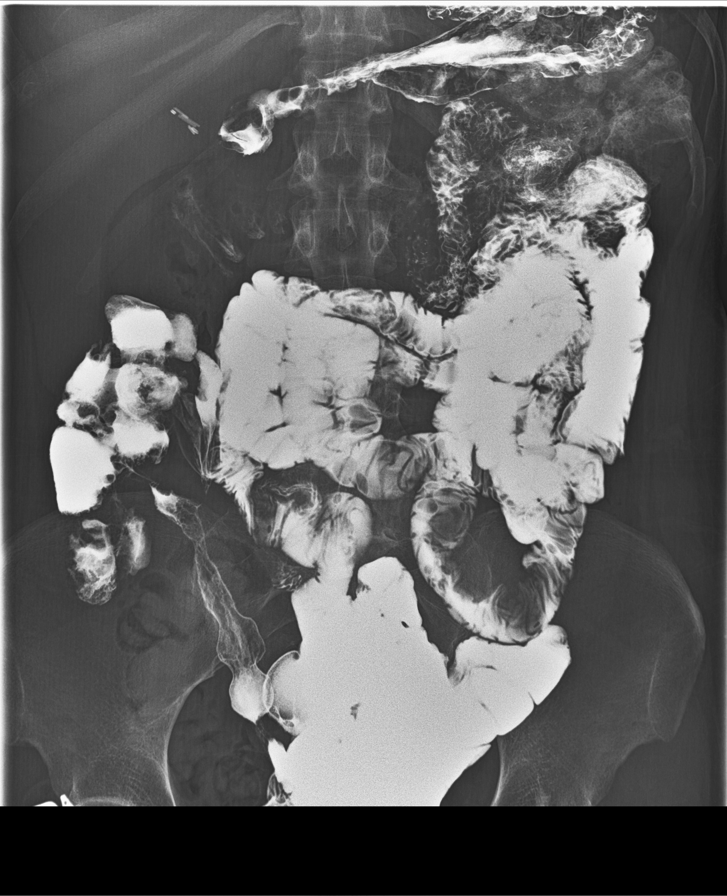

[abdomen kub (3 of 3)]
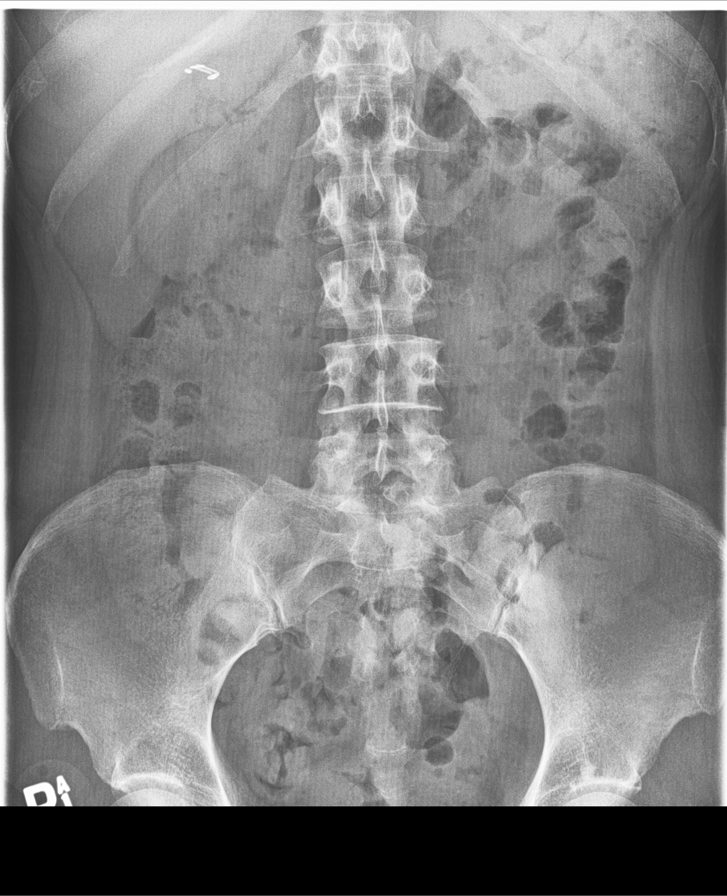

[3 of 3 positions shown; findings below may reference images not displayed]

FINDINGS: Scout view of the abdomen shows a fair amount of stool in the colon.
Surgical clips in the right upper quadrant.

Esophageal motility is normal. No esophageal fold thickening,
stricture or obstruction. Stomach is largely within normal limits
with the exception of a high-grade stricture in the peripyloric
stomach. Duodenal bulb and small bowel fold pattern are normal. Mild
irregularity of the terminal ileum. The ileocecal junction failed to
distend, despite changes in patient position.
IMPRESSION: 1. Peripyloric high-grade stricture, as on recent endoscopy.
2. Slight irregularity of the terminal ileum, consistent with the
given history of Crohn disease.
3. Failure to adequately distend the ileocecal junction. Difficult
to exclude a stricture.

## 2017-08-08 ENCOUNTER — Telehealth: Payer: Self-pay | Admitting: Internal Medicine

## 2017-08-08 NOTE — Telephone Encounter (Signed)
Patient states that with insurance medication Delsa Grana is now $200 through [facility]. Patient wanting to know what to do to get this cheaper.

## 2017-08-08 NOTE — Telephone Encounter (Signed)
Spoke with pt and gave her the phone number to call [employer] [facility] for copay card to perhaps get the Stelara for $5/dose.

## 2017-08-15 ENCOUNTER — Telehealth: Payer: Self-pay | Admitting: Internal Medicine

## 2017-08-15 NOTE — Telephone Encounter (Signed)
Pt received Stelara infusion 07/18/17, she was not due Stelara injection until 09/12/17. Pt received the injection in the mail last week and she gave it to herself on Friday. Pt wants to know what she is supposed to do going forward now. Please advise.

## 2017-08-15 NOTE — Telephone Encounter (Signed)
This should be okay However, going forward she should continue Stelara 90 mg subcu every 8 weeks.  Her next dose should be 8 weeks after her most recent injection

## 2017-08-15 NOTE — Telephone Encounter (Signed)
Spoke with pt and she is aware. Pt knows to take the Stelara 8 weeks from last Friday.

## 2017-09-27 ENCOUNTER — Telehealth: Payer: Self-pay | Admitting: Internal Medicine

## 2017-09-27 NOTE — Telephone Encounter (Signed)
Pt aware.

## 2017-09-27 NOTE — Telephone Encounter (Signed)
Patient due for stelara on 6/17. States she has not received it yet from the pharmacy, gave pt the phone number to [place] and she will call for med.  Pt wanted to let Dr. [name] know that her husband has recently been diagnosed with a malignant brain tumor. He has had surgery and is getting ready to start radiation treatments. States that since this has been going on she has been having diarrhea about everyday. She is taking Lomotil QID and Imodium 2 in the am and 2 more in the evening if needed. Pt wants to know if this is caused by the stress/anxiety? Wants to know if there is something else she can do. Please advise.

## 2017-09-27 NOTE — Telephone Encounter (Signed)
I am sorry to hear of her husband's brain tumor.  I hope and wished him very well.  Likely diarrhea is IBS related with the stress of her husband's cancer diagnosis though I cannot exclude that this would flare her IBD She can use 2 Lomotil tabs 4 times daily; Imodium could be a back-up for this Have her try the higher dose of Lomotil and let me know if not improving and we can do further testing/assessment

## 2017-10-27 ENCOUNTER — Other Ambulatory Visit: Payer: Self-pay | Admitting: Internal Medicine

## 2017-10-30 IMAGING — CT CT ABD-PELV W/ CM
2 of 5 series · 16 of 46 positions shown, 18 images · IV contrast (iopamidol)
Comparison: 11/30/2015

CLINICAL DATA: Abdominal pain and nausea. History of Crohn's
disease.

EXAM:
CT ABDOMEN AND PELVIS WITH CONTRAST
TECHNIQUE: Multidetector CT imaging of the abdomen and pelvis was performed
using the standard protocol following bolus administration of
intravenous contrast.
CONTRAST:  <See Chart> [id number] IOPAMIDOL ([id number]) INJECTION
61%

[Series 2: abd/pel with · axial · 0.74mm/px · z∈[-419,-34]mm · 13 of 89 slices shown, 15 images]
[im 6/89  soft-tissue]
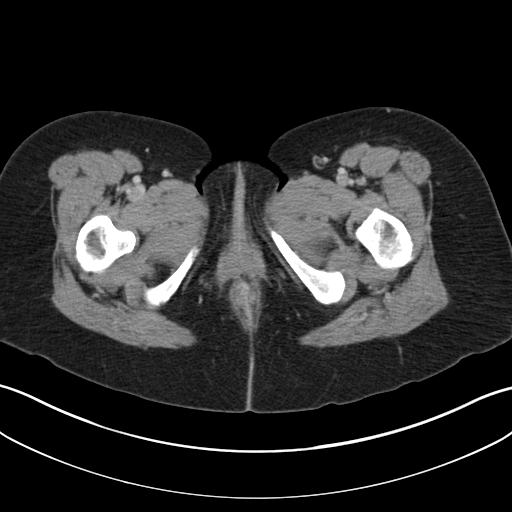
[im 6/89  bone]
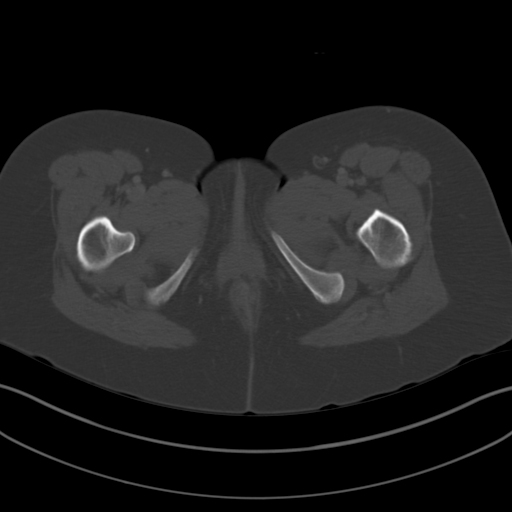
[im 12/89  soft-tissue]
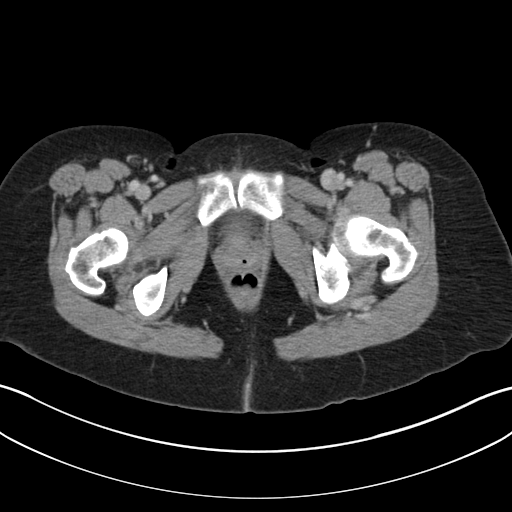
[im 17/89  soft-tissue]
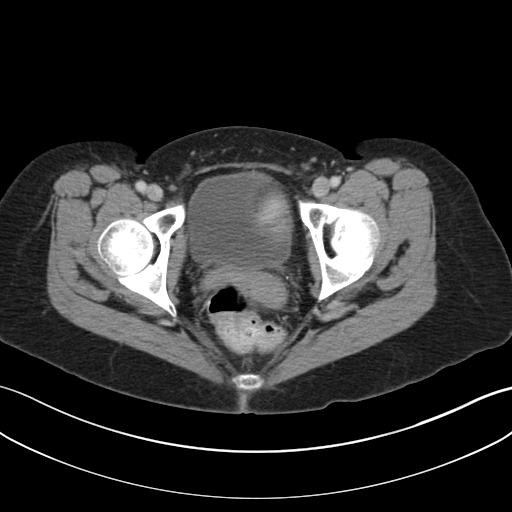
[im 28/89  soft-tissue]
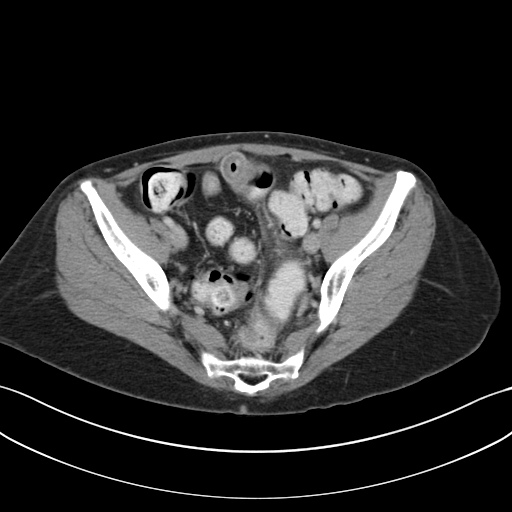
[im 34/89  soft-tissue]
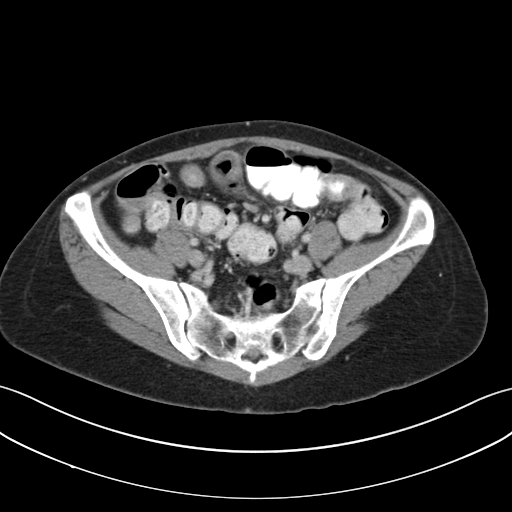
[im 39/89  soft-tissue]
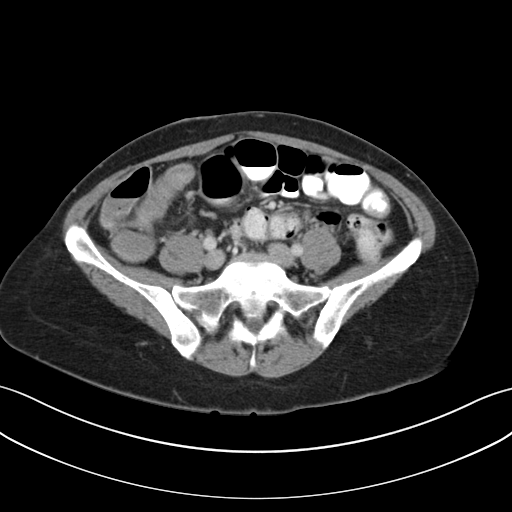
[im 45/89  soft-tissue]
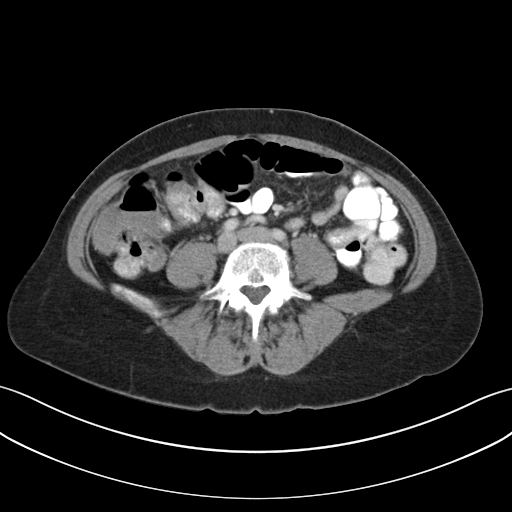
[im 50/89  soft-tissue]
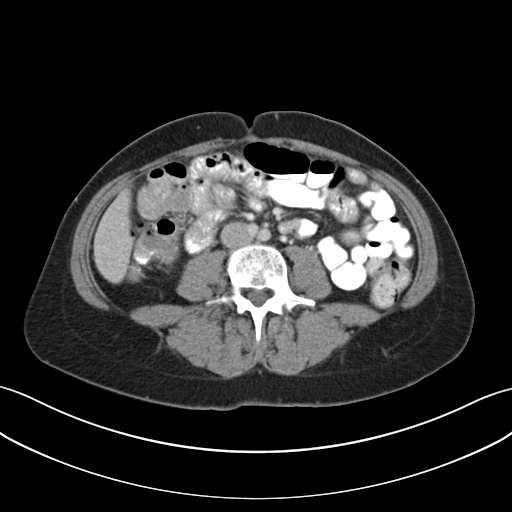
[im 56/89  soft-tissue]
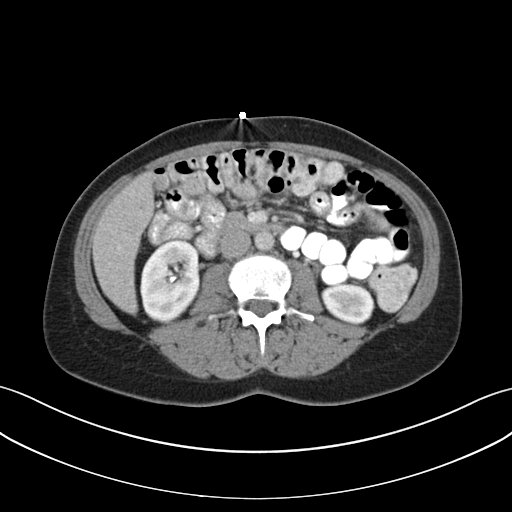
[im 56/89  bone]
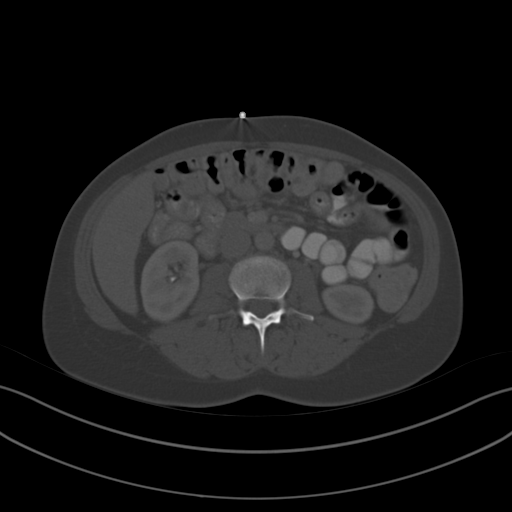
[im 61/89  soft-tissue]
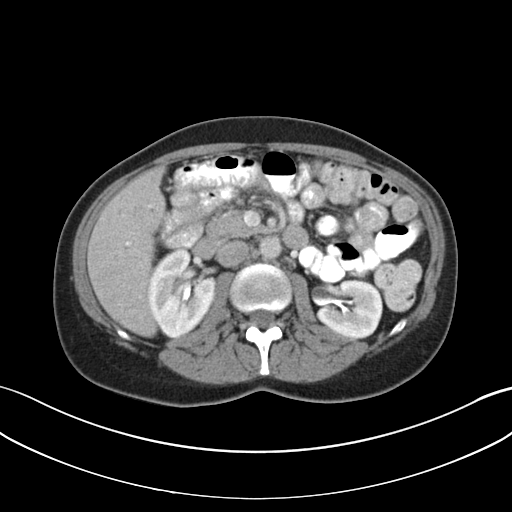
[im 72/89  soft-tissue]
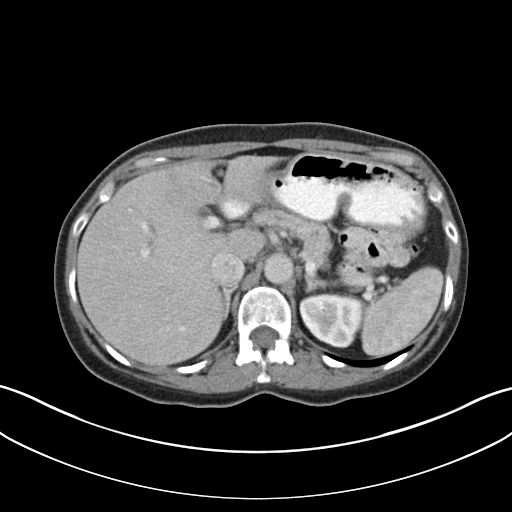
[im 78/89  soft-tissue]
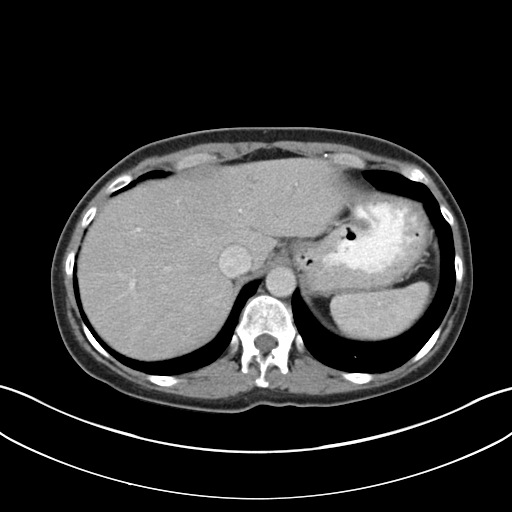
[im 83/89  soft-tissue]
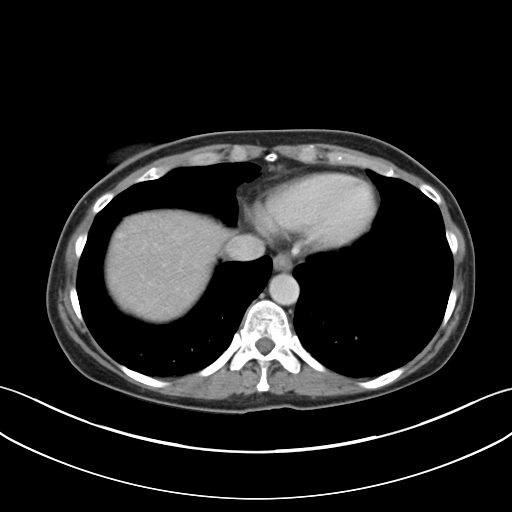

[Series 4: coronal a/|p · coronal · 0.74mm/px · 3 of 154 slices shown]
[im 52/154  soft-tissue]
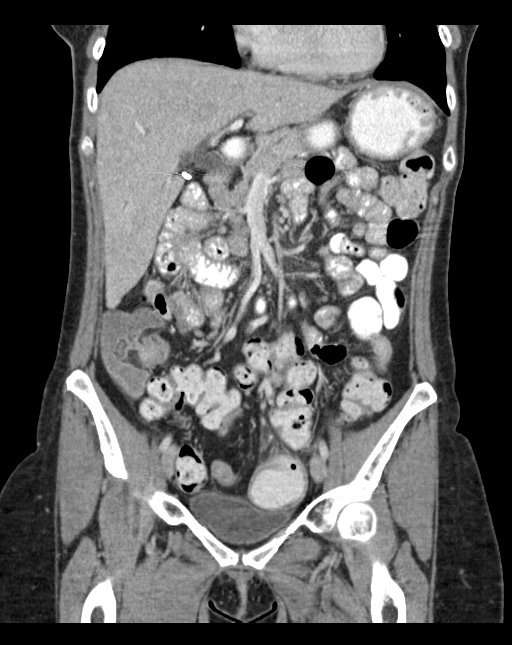
[im 69/154  soft-tissue]
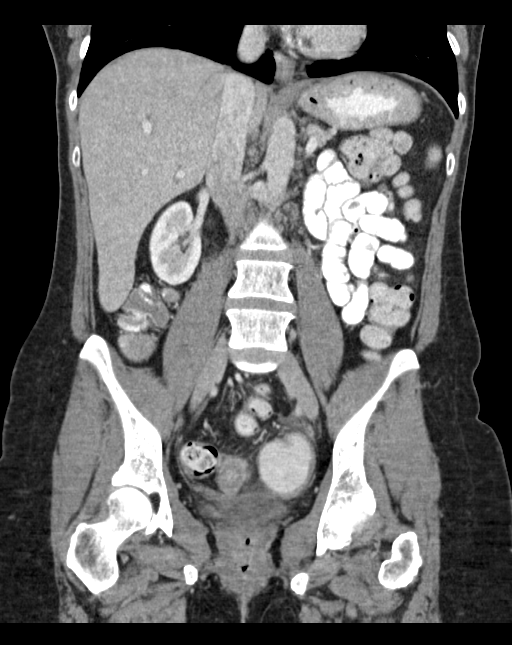
[im 86/154  soft-tissue]
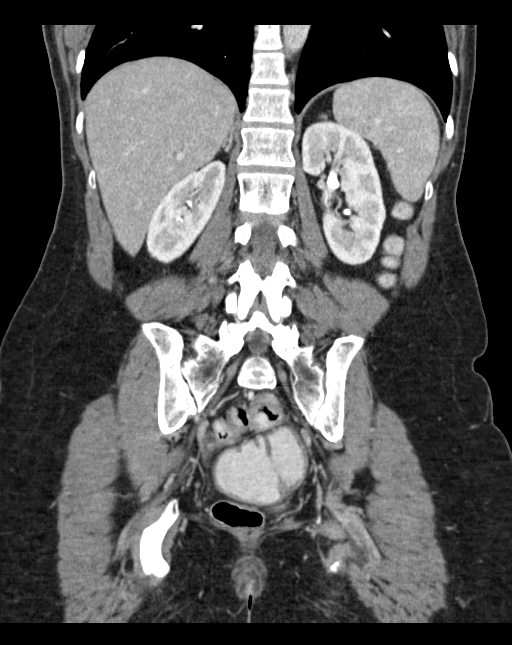

[16 of 46 positions shown; findings below may reference images not displayed]

FINDINGS: Lower chest: Normal

Hepatobiliary: Liver parenchyma is normal. Previous cholecystectomy.

Pancreas: Normal

Spleen: Normal

Adrenals/Urinary Tract: Adrenal glands are normal. Urinary tract
contrast present initially. Therefore cannot evaluate for renal
calculi. No sign of mass, cyst or obstruction. No bladder
abnormality.

Stomach/Bowel: No evidence of bowel obstruction. There is a long
segment of mid to distal ileum which shows wall thickening and
segmental dilatation, consistent with Crohn's involvement. The
terminal ileum itself shows only mild wall thickening. No evidence
of abscess, free fluid or air.

Vascular/Lymphatic: Normal

Reproductive: Previous hysterectomy.  No pelvic mass.

Other: No free fluid or air.

Musculoskeletal: Negative
IMPRESSION: Chronic Crohn's involvement of the mid through distal ileum with
wall thickening. Segmental dilatation in the midportion of that
segment with a diameter up to 5 cm. This could indicate an
inflammatory stricture at the transition zone.

## 2017-11-01 ENCOUNTER — Other Ambulatory Visit: Payer: Self-pay | Admitting: Internal Medicine

## 2017-11-01 ENCOUNTER — Telehealth: Payer: Self-pay | Admitting: Internal Medicine

## 2017-11-01 MED ORDER — OMEPRAZOLE 40 MG PO CPDR: 40.0000 mg | DELAYED_RELEASE_CAPSULE | Freq: Two times a day (BID) | ORAL | 3 refills | Status: DC

## 2017-11-01 NOTE — Telephone Encounter (Signed)
Rx sent 

## 2017-11-01 NOTE — Telephone Encounter (Signed)
Dr [name], may I continue to go ahead and refill the omeprazole for [name] for now? Your last office visit 05/18/17 indicated she needed follow up with you in 4 months. However, looks like she taking care of her husband with brain cancer at this time.Marland KitchenMarland KitchenMarland Kitchen

## 2017-11-01 NOTE — Telephone Encounter (Signed)
Yes ok to refill 

## 2017-11-06 ENCOUNTER — Other Ambulatory Visit: Payer: Self-pay | Admitting: Internal Medicine

## 2018-02-14 ENCOUNTER — Other Ambulatory Visit: Payer: Self-pay

## 2018-02-14 ENCOUNTER — Telehealth: Payer: Self-pay | Admitting: Internal Medicine

## 2018-02-14 DIAGNOSIS — R5383 Other fatigue: Secondary | ICD-10-CM

## 2018-02-14 NOTE — Telephone Encounter (Signed)
Pt aware, orders in epic. Pt scheduled to see Dr. [name] 03/30/18@11am . Appt letter mailed to pt.

## 2018-02-14 NOTE — Telephone Encounter (Signed)
CBC, ferritin, CMP, B12, TSH Office visit, next available for continuity if not already scheduled

## 2018-02-14 NOTE — Telephone Encounter (Signed)
Pt states she has been really tired recently. Wonders if she should come and have some labs checked. Please advise.

## 2018-02-21 ENCOUNTER — Other Ambulatory Visit: Payer: Self-pay | Admitting: Internal Medicine

## 2018-02-22 ENCOUNTER — Other Ambulatory Visit (INDEPENDENT_AMBULATORY_CARE_PROVIDER_SITE_OTHER): Payer: BLUE CROSS/BLUE SHIELD

## 2018-02-22 DIAGNOSIS — R5383 Other fatigue: Secondary | ICD-10-CM

## 2018-02-22 LAB — CBC WITH DIFFERENTIAL/PLATELET
BASOS ABS: 0.1 10*3/uL (ref 0.0–0.1)
Basophils Relative: 0.8 % (ref 0.0–3.0)
EOS PCT: 3 % (ref 0.0–5.0)
Eosinophils Absolute: 0.3 10*3/uL (ref 0.0–0.7)
HCT: 41.4 % (ref 36.0–46.0)
Hemoglobin: 13.8 g/dL (ref 12.0–15.0)
LYMPHS ABS: 4.5 10*3/uL — AB (ref 0.7–4.0)
Lymphocytes Relative: 42 % (ref 12.0–46.0)
MCHC: 33.4 g/dL (ref 30.0–36.0)
MCV: 92.2 fl (ref 78.0–100.0)
MONO ABS: 0.6 10*3/uL (ref 0.1–1.0)
Monocytes Relative: 5.8 % (ref 3.0–12.0)
NEUTROS ABS: 5.2 10*3/uL (ref 1.4–7.7)
Neutrophils Relative %: 48.4 % (ref 43.0–77.0)
Platelets: 266 10*3/uL (ref 150.0–400.0)
RBC: 4.49 Mil/uL (ref 3.87–5.11)
RDW: 14.5 % (ref 11.5–15.5)
WBC: 10.8 10*3/uL — ABNORMAL HIGH (ref 4.0–10.5)

## 2018-02-22 LAB — TSH: TSH: 2.6 u[IU]/mL (ref 0.35–4.50)

## 2018-02-23 LAB — COMPREHENSIVE METABOLIC PANEL
ALBUMIN: 3.9 g/dL (ref 3.5–5.2)
ALK PHOS: 52 U/L (ref 39–117)
ALT: 13 U/L (ref 0–35)
AST: 16 U/L (ref 0–37)
BUN: 15 mg/dL (ref 6–23)
CO2: 25 mEq/L (ref 19–32)
CREATININE: 0.99 mg/dL (ref 0.40–1.20)
Calcium: 9.6 mg/dL (ref 8.4–10.5)
Chloride: 107 mEq/L (ref 96–112)
GFR: 62.51 mL/min (ref >60.00)
GLUCOSE: 81 mg/dL (ref 70–99)
POTASSIUM: 4.2 meq/L (ref 3.5–5.1)
Sodium: 142 mEq/L (ref 135–145)
TOTAL PROTEIN: 6.4 g/dL (ref 6.0–8.3)
Total Bilirubin: 0.4 mg/dL (ref 0.2–1.2)

## 2018-02-23 LAB — FERRITIN: FERRITIN: 8.8 ng/mL — AB (ref 10.0–291.0)

## 2018-02-23 LAB — VITAMIN B12: VITAMIN B 12: 702 pg/mL (ref 211–911)

## 2018-02-26 ENCOUNTER — Other Ambulatory Visit: Payer: Self-pay

## 2018-02-26 DIAGNOSIS — D509 Iron deficiency anemia, unspecified: Secondary | ICD-10-CM

## 2018-02-27 ENCOUNTER — Telehealth: Payer: Self-pay

## 2018-02-27 ENCOUNTER — Other Ambulatory Visit: Payer: Self-pay

## 2018-02-27 DIAGNOSIS — D509 Iron deficiency anemia, unspecified: Secondary | ICD-10-CM

## 2018-02-27 NOTE — Telephone Encounter (Signed)
Pt scheduled for feraheme at pt care center at [facility] 11/12@11am  and 11/19@9am . Pt aware and orders in epic.

## 2018-03-06 ENCOUNTER — Ambulatory Visit (HOSPITAL_COMMUNITY)
Admission: RE | Admit: 2018-03-06 | Discharge: 2018-03-06 | Disposition: A | Discharge status: Home / Self Care | Payer: BLUE CROSS/BLUE SHIELD | Source: Ambulatory Visit | Attending: Internal Medicine | Admitting: Internal Medicine

## 2018-03-06 DIAGNOSIS — D509 Iron deficiency anemia, unspecified: Secondary | ICD-10-CM | POA: Diagnosis not present

## 2018-03-06 MED ORDER — SODIUM CHLORIDE 0.9 % IV SOLN
INTRAVENOUS | Status: DC | PRN
  Administered 2018-03-06: 250 mL via INTRAVENOUS

## 2018-03-06 MED ORDER — SODIUM CHLORIDE 0.9 % IV SOLN
510.0000 mg | INTRAVENOUS | Status: DC
  Administered 2018-03-06: 510 mg via INTRAVENOUS
  Filled 2018-03-06: qty 17

## 2018-03-06 NOTE — Discharge Instructions (Signed)

## 2018-03-06 NOTE — Progress Notes (Signed)
PATIENT CARE CENTER NOTE  Diagnosis: Iron Deficiency Anemia    Provider: Dr. Zenovia Jarred   Procedure: IV Feraheme    Note: Patient received Feraheme infusion. Monitored patient for 30 minutes post-infusion. Tolerated infusion well with no adverse reaction. Discharge instructions given. Patient alert, oriented and ambulatory at discharge.

## 2018-03-13 ENCOUNTER — Ambulatory Visit (HOSPITAL_COMMUNITY)
Admission: RE | Admit: 2018-03-13 | Discharge: 2018-03-13 | Disposition: A | Discharge status: Home / Self Care | Payer: BLUE CROSS/BLUE SHIELD | Source: Ambulatory Visit | Attending: Internal Medicine | Admitting: Internal Medicine

## 2018-03-13 DIAGNOSIS — D509 Iron deficiency anemia, unspecified: Secondary | ICD-10-CM | POA: Diagnosis not present

## 2018-03-13 MED ORDER — SODIUM CHLORIDE 0.9 % IV SOLN
INTRAVENOUS | Status: DC | PRN
  Administered 2018-03-13: 250 mL via INTRAVENOUS

## 2018-03-13 MED ORDER — SODIUM CHLORIDE 0.9 % IV SOLN
510.0000 mg | Freq: Once | INTRAVENOUS | Status: AC
  Administered 2018-03-13: 510 mg via INTRAVENOUS
  Filled 2018-03-13: qty 17

## 2018-03-13 NOTE — Progress Notes (Signed)
PATIENT CARE CENTER NOTE  Diagnosis: Iron Deficiency Anemia    Provider: Dr. Zenovia Jarred   Procedure: IV Feraheme    Note: Patient received Feraheme infusion. Monitored patient for 30 minutes post-infusion. Tolerated infusion well with no adverse reaction. Discharge instructions given. Patient alert, oriented and ambulatory at discharge.

## 2018-03-13 NOTE — Discharge Instructions (Signed)

## 2018-03-30 ENCOUNTER — Other Ambulatory Visit (INDEPENDENT_AMBULATORY_CARE_PROVIDER_SITE_OTHER): Payer: BLUE CROSS/BLUE SHIELD

## 2018-03-30 ENCOUNTER — Encounter: Payer: Self-pay | Admitting: Internal Medicine

## 2018-03-30 ENCOUNTER — Ambulatory Visit: Payer: BLUE CROSS/BLUE SHIELD | Admitting: Internal Medicine

## 2018-03-30 VITALS — BP 106/76 | HR 87 | Ht 64.0 in | Wt 149.0 lb

## 2018-03-30 DIAGNOSIS — K315 Obstruction of duodenum: Secondary | ICD-10-CM | POA: Diagnosis not present

## 2018-03-30 DIAGNOSIS — Z79899 Other long term (current) drug therapy: Secondary | ICD-10-CM

## 2018-03-30 DIAGNOSIS — M79651 Pain in right thigh: Secondary | ICD-10-CM

## 2018-03-30 DIAGNOSIS — D509 Iron deficiency anemia, unspecified: Secondary | ICD-10-CM

## 2018-03-30 DIAGNOSIS — K5090 Crohn's disease, unspecified, without complications: Secondary | ICD-10-CM

## 2018-03-30 DIAGNOSIS — E538 Deficiency of other specified B group vitamins: Secondary | ICD-10-CM | POA: Diagnosis not present

## 2018-03-30 DIAGNOSIS — K5000 Crohn's disease of small intestine without complications: Secondary | ICD-10-CM | POA: Diagnosis not present

## 2018-03-30 DIAGNOSIS — M79652 Pain in left thigh: Secondary | ICD-10-CM

## 2018-03-30 DIAGNOSIS — Z85820 Personal history of malignant melanoma of skin: Secondary | ICD-10-CM

## 2018-03-30 DIAGNOSIS — K219 Gastro-esophageal reflux disease without esophagitis: Secondary | ICD-10-CM

## 2018-03-30 LAB — CBC WITH DIFFERENTIAL/PLATELET
BASOS PCT: 0.4 % (ref 0.0–3.0)
Basophils Absolute: 0.1 10*3/uL (ref 0.0–0.1)
Eosinophils Absolute: 0.3 10*3/uL (ref 0.0–0.7)
Eosinophils Relative: 2.6 % (ref 0.0–5.0)
HCT: 43.9 % (ref 36.0–46.0)
Hemoglobin: 14.8 g/dL (ref 12.0–15.0)
Lymphocytes Relative: 25.8 % (ref 12.0–46.0)
Lymphs Abs: 3.1 10*3/uL (ref 0.7–4.0)
MCHC: 33.6 g/dL (ref 30.0–36.0)
MCV: 94.4 fl (ref 78.0–100.0)
Monocytes Absolute: 0.7 10*3/uL (ref 0.1–1.0)
Monocytes Relative: 6.1 % (ref 3.0–12.0)
NEUTROS ABS: 7.9 10*3/uL — AB (ref 1.4–7.7)
Neutrophils Relative %: 65.1 % (ref 43.0–77.0)
PLATELETS: 245 10*3/uL (ref 150.0–400.0)
RBC: 4.65 Mil/uL (ref 3.87–5.11)
RDW: 17.2 % — AB (ref 11.5–15.5)
WBC: 12.1 10*3/uL — ABNORMAL HIGH (ref 4.0–10.5)

## 2018-03-30 LAB — COMPREHENSIVE METABOLIC PANEL
ALT: 18 U/L (ref 0–35)
AST: 13 U/L (ref 0–37)
Albumin: 4.1 g/dL (ref 3.5–5.2)
Alkaline Phosphatase: 43 U/L (ref 39–117)
BILIRUBIN TOTAL: 0.4 mg/dL (ref 0.2–1.2)
BUN: 14 mg/dL (ref 6–23)
CO2: 30 mEq/L (ref 19–32)
Calcium: 9.6 mg/dL (ref 8.4–10.5)
Chloride: 106 mEq/L (ref 96–112)
Creatinine, Ser: 0.92 mg/dL (ref 0.40–1.20)
GFR: 68 mL/min (ref >60.00)
Glucose, Bld: 100 mg/dL — ABNORMAL HIGH (ref 70–99)
Potassium: 3.5 mEq/L (ref 3.5–5.1)
Sodium: 144 mEq/L (ref 135–145)
Total Protein: 6.5 g/dL (ref 6.0–8.3)

## 2018-03-30 LAB — IBC PANEL
Iron: 115 ug/dL (ref 42–145)
Saturation Ratios: 39.3 % (ref 20.0–50.0)
Transferrin: 209 mg/dL — ABNORMAL LOW (ref 212.0–360.0)

## 2018-03-30 LAB — CK: Total CK: 46 U/L (ref 7–177)

## 2018-03-30 LAB — HIGH SENSITIVITY CRP: CRP, High Sensitivity: 0.87 mg/L (ref 0.000–5.000)

## 2018-03-30 LAB — FERRITIN: Ferritin: 170.5 ng/mL (ref 10.0–291.0)

## 2018-03-30 LAB — FOLATE: Folate: 4.3 ng/mL — ABNORMAL LOW (ref >5.9)

## 2018-03-30 LAB — MAGNESIUM: MAGNESIUM: 1.9 mg/dL (ref 1.5–2.5)

## 2018-03-30 MED ORDER — DIPHENOXYLATE-ATROPINE 2.5-0.025 MG PO TABS: 1.0000 | ORAL_TABLET | Freq: Four times a day (QID) | ORAL | 2 refills | Status: DC

## 2018-03-30 MED ORDER — OMEPRAZOLE 40 MG PO CPDR: DELAYED_RELEASE_CAPSULE | ORAL | 5 refills | Status: DC

## 2018-03-30 NOTE — Patient Instructions (Signed)
Your provider has requested that you go to the basement level for lab work before leaving today. Press "B" on the elevator. The lab is located at the first door on the left as you exit the elevator.  Please follow up with Dr [name] in 6 months.  Decrease omeprazole to once daily dosing.  Take a one a day multivitamin.  Continue stelara.  If you are age 52 or older, your body mass index should be between 23-30. Your Body mass index is 25.58 kg/m. If this is out of the aforementioned range listed, please consider follow up with your Primary Care Provider.  If you are age 70 or younger, your body mass index should be between 19-25. Your Body mass index is 25.58 kg/m. If this is out of the aformentioned range listed, please consider follow up with your Primary Care Provider.

## 2018-03-30 NOTE — Progress Notes (Signed)
Subjective:    Patient ID: Renee Atkinson, female    DOB: [birth year], 52 y.o.   MRN: [id number]  HPI Renee Atkinson is a 52 year old female with a past medical history of stricturing ileal Crohn's disease diagnosed in 2015 status post ileocecectomy in November 2018 with Dr. 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  He is here today with her husband.  She reports she is been doing very well.  Her only complaint is bilateral upper leg pain in the thigh and lateral thigh.  Comes and goes, better with Tylenol.  Can be worse in the morning but also worse after prolonged standing.  No weakness.  No lower back pain.  No abdominal pain.  Occasionally diarrhea particularly with stress or sweets.  Using Lomotil up to 2 times on a daily basis which works well.  No blood in her stool or melena.  No fevers or chills.  No rashes.  Very happy with Stelara response and postsurgical response.  Is under a great deal of stress as her husband is dealing with brain cancer.  Of note she developed a rash with Remicade, Humira caused flulike symptoms.  Azathioprine caused diffuse body aches and extreme fatigue  Review of Systems As per HPI, otherwise negative  Current Medications, Allergies, Past Medical History, Past Surgical History, Family History and Social History were reviewed in [employer] record.     Objective:   Physical Exam BP 106/76   Pulse 87   Ht 5' 4"  (1.626 m)   Wt 149 lb (67.6 kg)   BMI 25.58 kg/m  Constitutional: Well-developed and well-nourished. No distress. HEENT: Normocephalic and atraumatic.  Conjunctivae are normal.  No scleral icterus. Neck: Neck supple. Trachea midline. Cardiovascular: Normal rate, regular rhythm and intact distal pulses. No M/R/G Pulmonary/chest: Effort normal and breath sounds  normal. No wheezing, rales or rhonchi. Abdominal: Soft, nontender, nondistended. Bowel sounds active throughout. There are no masses palpable. No hepatosplenomegaly. Extremities: no clubbing, cyanosis, or edema Neurological: Alert and oriented to person place and time. Skin: Skin is warm and dry. Psychiatric: Normal mood and affect. Behavior is normal.  CBC    Component Value Date/Time   WBC 12.1 (H) 03/30/2018 1210   RBC 4.65 03/30/2018 1210   HGB 14.8 03/30/2018 1210   HCT 43.9 03/30/2018 1210   PLT 245.0 03/30/2018 1210   MCV 94.4 03/30/2018 1210   MCH 30.6 08/21/2016 1356   MCHC 33.6 03/30/2018 1210   RDW 17.2 (H) 03/30/2018 1210   LYMPHSABS 3.1 03/30/2018 1210   MONOABS 0.7 03/30/2018 1210   EOSABS 0.3 03/30/2018 1210   BASOSABS 0.1 03/30/2018 1210   CMP     Component Value Date/Time   NA 144 03/30/2018 1210   K 3.5 03/30/2018 1210   CL 106 03/30/2018 1210   CO2 30 03/30/2018 1210   GLUCOSE 100 (H) 03/30/2018 1210   BUN 14 03/30/2018 1210   CREATININE 0.92 03/30/2018 1210   CALCIUM 9.6 03/30/2018 1210   PROT 6.5 03/30/2018 1210   ALBUMIN 4.1 03/30/2018 1210   AST 13 03/30/2018 1210   ALT 18 03/30/2018 1210   ALKPHOS 43 03/30/2018 1210   BILITOT 0.4 03/30/2018 1210   GFRNONAA >60 08/21/2016 1356   GFRAA >60 08/21/2016 1356   Lab Results  Component Value Date   XNTZGYFV49 449 02/22/2018  Iron/TIBC/Ferritin/ %Sat    Component Value Date/Time   IRON 115 03/30/2018 1210   FERRITIN 170.5 03/30/2018 1210   IRONPCTSAT 39.3 03/30/2018 1210   Folate 4.3 Mg 1.9 CK normal      Assessment & Plan:  52 year old female with a past medical history of stricturing ileal Crohn's disease diagnosed in 2015 status post ileocecectomy in November 2018 with Dr. 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  1.   Ileal Crohn's disease with history of stricture diagnosis 2015/status post ileocecectomy November 2018 --from a Crohn's perspective she is doing well.  She is recovered well after ileocecectomy.  Tolerating Stelara and will continue with maintenance dose Stelara injection every 8 weeks. --She is received influenza vaccination this year and prior Pneumovax  2.  Bilateral upper leg/thigh pain --unclear etiology.  I cannot completely exclude that this is not a side effect from Stelara though this would be a rare side effect.  CK was checked today and normal.  Her CRP is also normal and decreased from prior.  Iron is better after IV iron.  Folate was low and we are going to supplement that, perhaps this is contributing.  She is also discussed with primary care next week at her appointment.  Niacin and thiamine pending.  B12 checked earlier this year and okay.  Also recommended daily multivitamin  3.  Low folate --see #2.  Begin folic acid 1 mg daily plus multivitamin  4.  History of IDA --status post IV iron in November, iron studies very good today.  No anemia.  Monitor going forward.  Etiology felt secondary to Crohn's  5.  History of melanoma --she is following and should continue to follow closely with dermatology  6.  GERD with history of peptic stricture --on omeprazole 40 mg twice daily.  No symptoms of heartburn, dyspepsia or stricture.  Reduce omeprazole to 40 mg once daily.  Strongly avoid NSAIDs.  Notify me if reducing dose causes any recurrent heartburn or dyspeptic symptoms.  62-monthfollow-up, sooner if necessary

## 2018-04-03 LAB — VITAMIN D 1,25 DIHYDROXY
VITAMIN D 1, 25 (OH) TOTAL: 46 pg/mL (ref 18–72)
Vitamin D2 1, 25 (OH)2: <8 pg/mL
Vitamin D3 1, 25 (OH)2: 46 pg/mL

## 2018-04-03 LAB — VITAMIN B6: VITAMIN B6: 71.5 ng/mL — AB (ref 2.1–21.7)

## 2018-04-03 LAB — VITAMIN B1: Vitamin B1 (Thiamine): 16 nmol/L (ref 8–30)

## 2018-04-06 ENCOUNTER — Telehealth: Payer: Self-pay | Admitting: Internal Medicine

## 2018-04-06 MED ORDER — FOLIC ACID 1 MG PO TABS: 1.0000 mg | ORAL_TABLET | Freq: Every day | ORAL | 6 refills | Status: DC

## 2018-04-06 NOTE — Telephone Encounter (Signed)
Pt aware of results, script sent to pharmacy for folic acid 23m.

## 2018-04-23 ENCOUNTER — Telehealth: Payer: Self-pay | Admitting: Internal Medicine

## 2018-04-23 NOTE — Telephone Encounter (Signed)
Pt gets the Stelara for a 5.00 copay. Discussed with pt that the company should let us know when she needs to sign up for anything else.

## 2018-05-01 ENCOUNTER — Telehealth: Payer: Self-pay

## 2018-05-01 NOTE — Telephone Encounter (Signed)
Can substitute pantoprazole 40 mg twice daily AC which does not have interaction with Celexa

## 2018-05-01 NOTE — Telephone Encounter (Signed)
Spoke with [place] NP and she is aware and will notify pt and send script in for pt.

## 2018-05-01 NOTE — Telephone Encounter (Signed)
Received call from NP Delight Ovens with [facility]. She wants to know if Dr. [name] thinks we should change pts meds. States she got a [employer]arry manager from [employer] stating pts celexa and omeprazole could cause a prolonged QT wave. Pt does not want to change her celexa and not really interested in changing omeprazole. Pt wanted to know if Dr. [name] thinks anything needs to change, wanted NP to check with Dr. [name] to get his thoughts. Please advise. [phone].

## 2018-07-30 ENCOUNTER — Other Ambulatory Visit: Payer: Self-pay | Admitting: Internal Medicine

## 2018-08-13 ENCOUNTER — Other Ambulatory Visit: Payer: Self-pay | Admitting: Internal Medicine

## 2018-11-08 ENCOUNTER — Encounter: Payer: Self-pay | Admitting: *Deleted

## 2018-11-28 ENCOUNTER — Telehealth: Payer: Self-pay | Admitting: Internal Medicine

## 2018-11-28 DIAGNOSIS — K5000 Crohn's disease of small intestine without complications: Secondary | ICD-10-CM

## 2018-11-28 DIAGNOSIS — D509 Iron deficiency anemia, unspecified: Secondary | ICD-10-CM

## 2018-11-28 NOTE — Telephone Encounter (Signed)
Pt thinks that her iron levels are low again,she states that she feels very tired like last time when the same thing happened. Pls call her.

## 2018-11-28 NOTE — Telephone Encounter (Signed)
[facility] Iron + ferritin Quant GOLD CMP

## 2018-11-28 NOTE — Telephone Encounter (Signed)
Spoke with pt and she is aware, lab orders in epic. Pt will come for labs.

## 2018-11-28 NOTE — Telephone Encounter (Signed)
Pt states she has no energy, she feels like she did before she got the iron infusions. Thinks her iron level is low again. Please advise.

## 2018-11-29 ENCOUNTER — Other Ambulatory Visit (INDEPENDENT_AMBULATORY_CARE_PROVIDER_SITE_OTHER): Payer: BC Managed Care – PPO

## 2018-11-29 DIAGNOSIS — D509 Iron deficiency anemia, unspecified: Secondary | ICD-10-CM

## 2018-11-29 DIAGNOSIS — K5000 Crohn's disease of small intestine without complications: Secondary | ICD-10-CM

## 2018-11-29 LAB — COMPREHENSIVE METABOLIC PANEL
ALT: 13 U/L (ref 0–35)
AST: 14 U/L (ref 0–37)
Albumin: 3.6 g/dL (ref 3.5–5.2)
Alkaline Phosphatase: 44 U/L (ref 39–117)
BUN: 11 mg/dL (ref 6–23)
CO2: 32 mEq/L (ref 19–32)
Calcium: 9.2 mg/dL (ref 8.4–10.5)
Chloride: 106 mEq/L (ref 96–112)
Creatinine, Ser: 0.91 mg/dL (ref 0.40–1.20)
GFR: 64.63 mL/min (ref >60.00)
Glucose, Bld: 74 mg/dL (ref 70–99)
Potassium: 3.4 mEq/L — ABNORMAL LOW (ref 3.5–5.1)
Sodium: 142 mEq/L (ref 135–145)
Total Bilirubin: 0.4 mg/dL (ref 0.2–1.2)
Total Protein: 5.8 g/dL — ABNORMAL LOW (ref 6.0–8.3)

## 2018-11-29 LAB — CBC WITH DIFFERENTIAL/PLATELET
Basophils Absolute: 0.1 10*3/uL (ref 0.0–0.1)
Basophils Relative: 0.8 % (ref 0.0–3.0)
Eosinophils Absolute: 0.3 10*3/uL (ref 0.0–0.7)
Eosinophils Relative: 3.7 % (ref 0.0–5.0)
HCT: 42.7 % (ref 36.0–46.0)
Hemoglobin: 14.4 g/dL (ref 12.0–15.0)
Lymphocytes Relative: 41.2 % (ref 12.0–46.0)
Lymphs Abs: 3.2 10*3/uL (ref 0.7–4.0)
MCHC: 33.6 g/dL (ref 30.0–36.0)
MCV: 96.8 fl (ref 78.0–100.0)
Monocytes Absolute: 0.4 10*3/uL (ref 0.1–1.0)
Monocytes Relative: 5.7 % (ref 3.0–12.0)
Neutro Abs: 3.8 10*3/uL (ref 1.4–7.7)
Neutrophils Relative %: 48.6 % (ref 43.0–77.0)
Platelets: 202 10*3/uL (ref 150.0–400.0)
RBC: 4.41 Mil/uL (ref 3.87–5.11)
RDW: 13.5 % (ref 11.5–15.5)
WBC: 7.7 10*3/uL (ref 4.0–10.5)

## 2018-11-29 LAB — IBC + FERRITIN
Ferritin: 73.3 ng/mL (ref 10.0–291.0)
Iron: 64 ug/dL (ref 42–145)
Saturation Ratios: 24.1 % (ref 20.0–50.0)
Transferrin: 190 mg/dL — ABNORMAL LOW (ref 212.0–360.0)

## 2018-12-03 LAB — QUANTIFERON-TB GOLD PLUS
Mitogen-NIL: >10 IU/mL
NIL: 0.02 IU/mL
QuantiFERON-TB Gold Plus: NEGATIVE
TB1-NIL: 0 IU/mL
TB2-NIL: 0 IU/mL

## 2018-12-19 ENCOUNTER — Other Ambulatory Visit: Payer: Self-pay | Admitting: Internal Medicine

## 2019-07-19 ENCOUNTER — Other Ambulatory Visit: Payer: Self-pay | Admitting: Internal Medicine

## 2019-07-23 ENCOUNTER — Other Ambulatory Visit: Payer: Self-pay | Admitting: Internal Medicine

## 2019-07-23 NOTE — Telephone Encounter (Signed)
Pt is scheduled for an OV 10/01/19 and requested refills for lomotil and zofran.

## 2019-07-24 NOTE — Telephone Encounter (Signed)
Left message for patient to call back. Is she having new crohns symptoms that she wasn't having previously to make her inquire about getting these medications again? Doesn't look like she has had refills on either of these medications in quite sometime. If she is having new/worsening symptoms, she may need to be seen sooner by an extender.Marland KitchenMarland Kitchen

## 2019-07-25 NOTE — Telephone Encounter (Signed)
Left message for patient to call back  

## 2019-07-29 NOTE — Telephone Encounter (Signed)
Patient still has not returned call. We will await her return call at this time.

## 2019-07-31 NOTE — Telephone Encounter (Signed)
Yes, okay to refill until appointment

## 2019-07-31 NOTE — Telephone Encounter (Signed)
Dr [name], may I give patient refills of lomotil and zofran? She has not gotten either of these medications in quite a while and her last visit with [place] was in 03/2018 for her crohns. She is not scheduled to see [place] until 10/01/19 but doesn't really feel she should see [place] sooner. I spoke with her and she says she just feels she is having "more bowel movements than I should" at 2-3 bm daily. No bleeding or abdominal pain present. She also denies any vomiting etc. But says she wants to keep zofran on hand in case she needs it.  Please advise.Marland KitchenMarland Kitchen

## 2019-07-31 NOTE — Telephone Encounter (Signed)
Patient returned your call.

## 2019-08-01 NOTE — Addendum Note (Signed)
Addended by: Dorisann Frames L on: 08/01/2019 12:54 PM   Modules accepted: Orders

## 2019-08-01 NOTE — Telephone Encounter (Signed)
Left message for patient to return my call.

## 2019-08-05 ENCOUNTER — Telehealth: Payer: Self-pay | Admitting: Internal Medicine

## 2019-08-05 MED ORDER — DIPHENOXYLATE-ATROPINE 2.5-0.025 MG PO TABS: 1.0000 | ORAL_TABLET | Freq: Four times a day (QID) | ORAL | 0 refills | Status: DC

## 2019-08-05 MED ORDER — ONDANSETRON HCL 4 MG PO TABS: ORAL_TABLET | ORAL | 0 refills | Status: DC

## 2019-08-05 MED ORDER — STELARA 90 MG/ML ~~LOC~~ SOSY: 90.0000 mg | PREFILLED_SYRINGE | SUBCUTANEOUS | 0 refills | Status: DC

## 2019-08-05 NOTE — Telephone Encounter (Signed)
Ok to refill through the next appt which is in June 2021 thanks

## 2019-08-05 NOTE — Addendum Note (Signed)
Addended by: Larina Bras on: 08/05/2019 09:16 AM   Modules accepted: Orders

## 2019-08-05 NOTE — Telephone Encounter (Signed)
Rxs sent

## 2019-08-05 NOTE — Telephone Encounter (Signed)
Dr. [name],  Patient not seen since 03/2018 okay to refill Stelara?  How many?

## 2019-08-05 NOTE — Telephone Encounter (Signed)
Patient notified She is asked to make sure she keeps her appt with Dr. [name] for June

## 2019-08-06 NOTE — Telephone Encounter (Signed)
[facility].

## 2019-08-06 NOTE — Telephone Encounter (Signed)
[facility] called inquiring about rf for Stelara as they have not yet received approval. They were informed that prescription was approved yesterday by Dr. [name] and they should be receiving rf fax from [place] soon.

## 2019-08-13 ENCOUNTER — Other Ambulatory Visit (INDEPENDENT_AMBULATORY_CARE_PROVIDER_SITE_OTHER): Payer: BC Managed Care – PPO

## 2019-08-13 ENCOUNTER — Ambulatory Visit: Payer: BC Managed Care – PPO | Admitting: Nurse Practitioner

## 2019-08-13 VITALS — BP 116/74 | HR 70 | Temp 98.1°F | Ht 64.0 in | Wt 139.0 lb

## 2019-08-13 DIAGNOSIS — K5000 Crohn's disease of small intestine without complications: Secondary | ICD-10-CM

## 2019-08-13 LAB — COMPREHENSIVE METABOLIC PANEL
ALT: 10 U/L (ref 0–35)
AST: 13 U/L (ref 0–37)
Albumin: 4 g/dL (ref 3.5–5.2)
Alkaline Phosphatase: 44 U/L (ref 39–117)
BUN: 8 mg/dL (ref 6–23)
CO2: 30 mEq/L (ref 19–32)
Calcium: 9.5 mg/dL (ref 8.4–10.5)
Chloride: 103 mEq/L (ref 96–112)
Creatinine, Ser: 0.83 mg/dL (ref 0.40–1.20)
GFR: 71.68 mL/min (ref >60.00)
Glucose, Bld: 83 mg/dL (ref 70–99)
Potassium: 3.7 mEq/L (ref 3.5–5.1)
Sodium: 137 mEq/L (ref 135–145)
Total Bilirubin: 0.6 mg/dL (ref 0.2–1.2)
Total Protein: 6.4 g/dL (ref 6.0–8.3)

## 2019-08-13 LAB — CBC WITH DIFFERENTIAL/PLATELET
Basophils Absolute: 0.1 10*3/uL (ref 0.0–0.1)
Basophils Relative: 1.1 % (ref 0.0–3.0)
Eosinophils Absolute: 0.3 10*3/uL (ref 0.0–0.7)
Eosinophils Relative: 3 % (ref 0.0–5.0)
HCT: 41.6 % (ref 36.0–46.0)
Hemoglobin: 14.2 g/dL (ref 12.0–15.0)
Lymphocytes Relative: 41.8 % (ref 12.0–46.0)
Lymphs Abs: 3.6 10*3/uL (ref 0.7–4.0)
MCHC: 34.1 g/dL (ref 30.0–36.0)
MCV: 95.2 fl (ref 78.0–100.0)
Monocytes Absolute: 0.5 10*3/uL (ref 0.1–1.0)
Monocytes Relative: 6.1 % (ref 3.0–12.0)
Neutro Abs: 4.1 10*3/uL (ref 1.4–7.7)
Neutrophils Relative %: 48 % (ref 43.0–77.0)
Platelets: 223 10*3/uL (ref 150.0–400.0)
RBC: 4.37 Mil/uL (ref 3.87–5.11)
RDW: 13.7 % (ref 11.5–15.5)
WBC: 8.6 10*3/uL (ref 4.0–10.5)

## 2019-08-13 LAB — VITAMIN D 25 HYDROXY (VIT D DEFICIENCY, FRACTURES): VITD: 32.13 ng/mL (ref 30.00–100.00)

## 2019-08-13 LAB — IBC + FERRITIN
Ferritin: 75.6 ng/mL (ref 10.0–291.0)
Iron: 62 ug/dL (ref 42–145)
Saturation Ratios: 17.7 % — ABNORMAL LOW (ref 20.0–50.0)
Transferrin: 250 mg/dL (ref 212.0–360.0)

## 2019-08-13 LAB — FOLATE: Folate: 3.6 ng/mL — ABNORMAL LOW (ref >5.9)

## 2019-08-13 LAB — C-REACTIVE PROTEIN: CRP: <1 mg/dL (ref 0.5–20.0)

## 2019-08-13 LAB — VITAMIN B12: Vitamin B-12: 570 pg/mL (ref 211–911)

## 2019-08-13 MED ORDER — CHOLESTYRAMINE LIGHT 4 GM/DOSE PO POWD: 4.0000 g | Freq: Every day | ORAL | 1 refills | Status: DC

## 2019-08-13 NOTE — Progress Notes (Signed)
08/13/2019 NATANE HEWARD [id number] 02/08/1966   Chief Complaint: Crohn's disease follow up   History of Present Illness: Dillyn Joaquin is a 54 year old female with a past medical history of stricturing ileal Crohn's disease diagnosed in 2015 status post ileocecectomy in November 2018 with Dr. 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  She presents today for Crohn's follow-up.  She reports feeling quite well.  She remains on Stelara injections q. 8 weeks.  She was due for Stelara yesterday, however, she has not received the shipment from her specialty pharmacy.  She was informed by her pharmacy Stelara will be received tomorrow.  She denies having any nausea or vomiting.  No upper or lower abdominal pain.  She has diarrhea if she eats fast foods.  As long as she eats home-cooked meals her bowel movements are regular.  She typically passes a solid bowel movement every 3 days which is her normal bowel pattern.  She infrequently takes Marlou Starks if she has diarrhea and she requests a refill of this medication.  She denies seeing any rectal bleeding or mucus per the rectum.  No melena.  She avoids NSAIDs.  No GERD symptoms.  She remains on omeprazole 40 mg daily.  She denies having any recent illnesses.  She is current with her pneumococcal vaccination.  She received her flu vaccine this year.  She received her 2 Covid vaccinations February and March 2021.  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  Biopsies of the ascending colon showed benign lymphoid aggregate. No evidence of colitis.  She underwent an EGD on the same date which identified a pyloric ulcer, biopsies showed focally active gastritis  without evidence of Helicobacter pylori.  A repeat EGD was done 04/28/2016 which showed a widely patent Schatzki's ring, gastric stenosis identified at the pylorus, the previously seen ulceration was absent.  She underwent an upper GI series on 05/16/2016 which identified a peripyloric high-grade stricture and irregularity of the TI consistent with Crohn's disease.     Upper GI series 05/16/2016: 1. Peripyloric high-grade stricture, as on recent endoscopy. 2. Slight irregularity of the terminal ileum, consistent with the given history of Crohn disease. 3. Failure to adequately distend the ileocecal junction. Difficult to exclude a stricture.  EGD 04/28/2016:  - Normal mucosa was found in the entire esophagus. - Widely patent Schatzki ring. - Gastric stenosis was found at the pylorus unable to be traversed with the standard adult upper endoscope. The previously seen pyloric ulcer was not present.   EGD 02/08/2016: - Widely patent Schatzki ring. - 2 cm hiatal hernia. - Gastritis in the pre-pyloric antrum with pyloric channel ulcer causing narrowing at the pylorus (unable to traverse today).  Biopsies showed benign reactive antral gastric mucosa, chronic active gastritis without evidence of Helicobacter pylori.  No dysplasia.  Colonoscopy 02/08/2016: - Crohn's disease with ileitis. Biopsied. 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 - Mild diverticulosis in the sigmoid colon. - Normal mucosa in the entire examined colon without evidence of colitis.   Past Medical History:  Diagnosis Date  . Anxiety   . Atypical nevus 12/19/2003   SLIGHT-MODERATE LEFT ABDOMEN- NO TX CLEAR  . Atypical nevus 12/19/2003   SLIGHT-MODERATE- RIGHT UPPER BACK- NO TX CLEAR  .  Atypical nevus 11/12/2007   MODERATE- CENTER MID BACK- NO TX CLEAR  . Crohn's disease of ileum ([place]) [place] 2014  . Depression   . Diverticulosis   . Gastric outlet obstruction   . GERD (gastroesophageal reflux disease)   .  Hiatal hernia   . Insomnia   . 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 Peptic ulcer disease   . Schatzki's ring   . SVD (spontaneous vaginal delivery)    x 2   Current Outpatient Medications on File Prior to Visit  Medication Sig Dispense Refill  . acetaminophen (TYLENOL) 500 MG tablet Take 500 mg by mouth every 6 (six) hours as needed.    . Adapalene-Benzoyl Peroxide 0.1-2.5 % gel Apply 1 application topically at bedtime.     . citalopram (CELEXA) 40 MG tablet Take 40 mg by mouth at bedtime.    . clonazePAM (KLONOPIN) 1 MG tablet TAKE 1 TO 1 & 1/2 TABLETS TWICE DAILY AS NEEDED  2  . diphenoxylate-atropine (LOMOTIL) 2.5-0.025 MG tablet Take 1 tablet by mouth 4 (four) times daily. 120 tablet 0  . fluticasone (FLONASE) 50 MCG/ACT nasal spray Place 1 spray into both nostrils daily.     Marland Kitchen omeprazole (PRILOSEC) 40 MG capsule TAKE ONE CAPSULE BY MOUTH TWICE DAILY. TAKE BEFORE A MEAL. 60 capsule 5  . ondansetron (ZOFRAN) 4 MG tablet Take 1-2 tablets by mouth every 8 hours as needed for nausea. 60 tablet 0  . ustekinumab (STELARA) 90 MG/ML SOSY injection Inject 1 mL (90 mg total) into the skin every 8 (eight) weeks. 1 mL 0   Current Facility-Administered Medications on File Prior to Visit  Medication Dose Route Frequency Provider Last Rate Last Admin  . 0.9 %  sodium chloride infusion  500 mL Intravenous Continuous Pyrtle, Lajuan Lines, MD      . 0.9 %  sodium chloride infusion  500 mL Intravenous Continuous Pyrtle, Lajuan Lines, MD      . acetaminophen (TYLENOL) tablet 650 mg  650 mg Oral Once Jerene Bears, MD      . loratadine (CLARITIN) tablet 10 mg  10 mg Oral Once Pyrtle, Lajuan Lines, MD        Allergies  Allergen Reactions  . Dilaudid [Hydromorphone Hcl] Other (See Comments)    Severe headaches  . Humira [Adalimumab]     FEVER, MYALGIAS, NAUSEA, VOMITING  . Morphine And Related Nausea And Vomiting  . Benadryl [Diphenhydramine] Anxiety    Restless,  jittery, inability to be still  . Remicade [Infliximab] Rash     Current Medications, Allergies, Past Medical History, Past Surgical History, Family History and Social History were reviewed in [employer] record.   Physical Exam: BP 116/74   Pulse 70   Temp 98.1 F (36.7 C)   Ht 5' 4"  (1.626 m)   Wt 139 lb (63 kg)   BMI 23.86 kg/m  General: Well developed 54 year old female in no acute distress. Head: Normocephalic and atraumatic. Eyes: No scleral icterus. Conjunctiva pink . Ears: Normal auditory acuity. Mouth: Dentition intact. No ulcers or lesions.  Lungs: Clear throughout to auscultation. Heart: Regular rate and rhythm, no murmur. Abdomen: Soft, nontender and nondistended. No masses or hepatomegaly. Normal bowel sounds x 4 quadrants. Midline scar intact.  Rectal: Deferred.  Musculoskeletal: Symmetrical with no gross deformities. Extremities: No edema. Neurological: Alert oriented x 4. No focal deficits.  Psychological: Alert and cooperative. Normal mood and affect  Assessment and Recommendations:  1.  Ileal Crohn's disease with history of stricture diagnosis 2015, status post ileocecectomy November 2018 on [place].  -Continue Stelara injections every 8 weeks CBC, CMP, CRP, Vitamin D, iron, ferritin, B12 and folate -Dr.Pyrtle to verify next colonoscopy due date  -Follow up in the office in 6 months -Continue annual flu shot -Pneumovax up to date  -Questran Light 4gm pkt QD PRN RX renewed   2.  History of pyloric stenosis, asymptomatic -May need eventual surgical consultation

## 2019-08-13 NOTE — Progress Notes (Signed)
Addendum: Reviewed and agree with assessment and management plan. Would plan colonoscopy at the 10-year interval for screening given no colon polyps at last colonoscopy and lack of colonic involvement of her known Crohn's disease. However would repeat colonoscopy sooner if question of recurrent Crohn's disease or symptoms warrant investigation. Goldy Calandra, Lajuan Lines, MD

## 2019-08-13 NOTE — Patient Instructions (Signed)
If you are age 54 or older, your body mass index should be between 23-30. Your Body mass index is 23.86 kg/m. If this is out of the aforementioned range listed, please consider follow up with your Primary Care Provider.  If you are age 20 or younger, your body mass index should be between 19-25. Your Body mass index is 23.86 kg/m. If this is out of the aformentioned range listed, please consider follow up with your Primary Care Provider.  ----------------------------------------------------------------------------------------------------------------------------------------------------------- Your provider has requested that you go to the basement level for lab work before leaving today. Press "B" on the elevator. The lab is located at the first door on the left as you exit the elevator.   ------------------------------------------------------------------------------------------------------------------------------------------------------------ We have sent the following medications to your pharmacy for you to pick up at your convenience:  Continue with Cholestyramine light 4 gm packet 1 daily As needed.  ------------------------------------------------------------------------------------------------------------------------------------------------------------Due to recent changes in healthcare laws, you may see the results of your imaging and laboratory studies on MyChart before your provider has had a chance to review them.  We understand that in some cases there may be results that are confusing or concerning to you. Not all laboratory results come back in the same time frame and the provider may be waiting for multiple results in order to interpret others.  Please give [place] 48 hours in order for your provider to thoroughly review all the results before contacting the office for clarification of your results.  . Thank you for choosing [facility] Noralyn Pick, CRNP

## 2019-08-15 NOTE — Progress Notes (Signed)
I called patient and left a msg on her voice mail recall colonoscopy due 01/2026, earlier if symptoms warranted.  Olivia Mackie, pls enter colonoscopy recall for 01/2026. Thx.

## 2019-09-17 ENCOUNTER — Other Ambulatory Visit: Payer: Self-pay | Admitting: Internal Medicine

## 2019-10-01 ENCOUNTER — Ambulatory Visit: Payer: BC Managed Care – PPO | Admitting: Internal Medicine

## 2019-10-18 ENCOUNTER — Other Ambulatory Visit: Payer: Self-pay | Admitting: Internal Medicine

## 2019-11-15 ENCOUNTER — Other Ambulatory Visit: Payer: Self-pay | Admitting: Internal Medicine

## 2020-01-10 ENCOUNTER — Ambulatory Visit
Admission: EM | Admit: 2020-01-10 | Discharge: 2020-01-10 | Disposition: A | Discharge status: Home / Self Care | Payer: BC Managed Care – PPO | Attending: Emergency Medicine | Admitting: Emergency Medicine

## 2020-01-10 DIAGNOSIS — J209 Acute bronchitis, unspecified: Secondary | ICD-10-CM

## 2020-01-10 DIAGNOSIS — Z20822 Contact with and (suspected) exposure to covid-19: Secondary | ICD-10-CM

## 2020-01-10 DIAGNOSIS — J069 Acute upper respiratory infection, unspecified: Secondary | ICD-10-CM

## 2020-01-10 MED ORDER — BENZONATATE 200 MG PO CAPS: 200.0000 mg | ORAL_CAPSULE | Freq: Three times a day (TID) | ORAL | 0 refills | Status: AC | PRN

## 2020-01-10 MED ORDER — DEXAMETHASONE SODIUM PHOSPHATE 10 MG/ML IJ SOLN
10.0000 mg | Freq: Once | INTRAMUSCULAR | Status: AC
  Administered 2020-01-10: 10 mg via INTRAMUSCULAR

## 2020-01-10 MED ORDER — ALBUTEROL SULFATE HFA 108 (90 BASE) MCG/ACT IN AERS
2.0000 | INHALATION_SPRAY | Freq: Once | RESPIRATORY_TRACT | Status: AC
  Administered 2020-01-10: 2 via RESPIRATORY_TRACT

## 2020-01-10 MED ORDER — PREDNISONE 10 MG (21) PO TBPK: ORAL_TABLET | Freq: Every day | ORAL | 0 refills | Status: DC

## 2020-01-10 NOTE — Discharge Instructions (Signed)
COVID testing ordered.  It will take between 5-7 days for test results.  Someone will contact you regarding abnormal results.    In the meantime: You should remain isolated in your home for 10 days from symptom onset AND greater than 72 hours after symptoms resolution (absence of fever without the use of fever-reducing medication and improvement in respiratory symptoms), whichever is longer Get plenty of rest and push fluids Use OTC zyrtec for nasal congestion, runny nose, and/or sore throat Use OTC flonase for nasal congestion and runny nose Use medications daily for symptom relief Use OTC medications like ibuprofen or tylenol as needed fever or pain Call or go to the ED if you have any new or worsening symptoms such as fever, cough, shortness of breath, chest tightness, chest pain, turning blue, changes in mental status, etc..Marland Kitchen

## 2020-01-10 NOTE — ED Provider Notes (Signed)
[facility]   [id number] 01/10/20 Arrival Time: 1601   CC: COVID symptoms  SUBJECTIVE: History from: patient.  Renee Atkinson is a 54 y.o. female who presents with fatigue, sore throat, nasal congestion, chest congestion, runny nose, and cough x 5-6 days.  Denies sick exposure to COVID, flu or strep.  Admits to cleaning over the weekend and being exposed to dust.  Has tried OTC medications without relief.  Symptoms are made worse with deep breath.  Reports previous diagnosis of bronchitis.  Denies previous COVID infection.  Received both COVID vaccines and booster.   Denies fever, chills, SOB, wheezing, chest pain, nausea, changes in bowel or bladder habits.     ROS: As per HPI.  All other pertinent ROS negative.     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/2016   RIGHT LOWER ABDOMEN- TX WITH EXCISION   Peptic ulcer disease    Schatzki's ring    SVD (spontaneous vaginal delivery)    x 2   Past Surgical History:  Procedure Laterality Date   ABDOMINAL HYSTERECTOMY N/A 04/02/2013   Procedure: HYSTERECTOMY ABDOMINAL;  Surgeon: Sharene Butters, MD;  Location: [facility];  Service: Gynecology;  Laterality: N/A;   BIOPSY N/A 05/14/2013   Procedure: BIOPSY;  Surgeon: Danie Binder, MD;  Location: [facility];  Service: Endoscopy;  Laterality: N/A;  biopsies of the ileum, random colon biopsies & rectal biopsies   CHOLECYSTECTOMY     COLONOSCOPY N/A 03/18/2013   Dr. [name]: ileitis, ulcer at ICV, mild colitis in ascending  colon and sigmoid colon, moderate sized internal hemorrhoids. bx inconclusive either related to crohns or NSAIDs   COLONOSCOPY WITH PROPOFOL N/A 05/14/2013   SLF:Non-bleeding mucosal ulceration in the terminal ileum and at the ileocecal valve MOST LIKELY DUE TO CROHN'S DISEASE/Normal colon/Normal mucosa in RECTUM. Bx from TI, chronic active ileitis ?crohn's or NSAIDs. random colon and rectum bx negative.   LAPAROSCOPIC ILEOCECECTOMY  03/09/2017   SALPINGOOPHORECTOMY Bilateral 04/02/2013   Procedure: SALPINGO OOPHORECTOMY;  Surgeon: Sharene Butters, MD;  Location: [facility];  Service: Gynecology;  Laterality: Bilateral;   TUBAL LIGATION     Allergies  Allergen Reactions   Dilaudid [Hydromorphone Hcl] Other (See Comments)    Severe headaches   Humira [Adalimumab]     FEVER, MYALGIAS, NAUSEA, VOMITING   Morphine And Related Nausea And Vomiting   Benadryl [Diphenhydramine] Anxiety    Restless, jittery, inability to be still   Remicade [Infliximab] Rash   Current Facility-Administered Medications on File Prior to Encounter  Medication Dose Route Frequency Provider Last Rate Last Admin   0.9 %  sodium chloride infusion  500 mL Intravenous Continuous Pyrtle, Lajuan Lines, MD       0.9 %  sodium chloride infusion  500 mL Intravenous Continuous Pyrtle, Lajuan Lines, MD       acetaminophen (TYLENOL) tablet 650 mg  650 mg Oral Once Pyrtle, Lajuan Lines, MD       loratadine (  CLARITIN) tablet 10 mg  10 mg Oral Once Pyrtle, Lajuan Lines, MD       Current Outpatient Medications on File Prior to Encounter  Medication Sig Dispense Refill   acetaminophen (TYLENOL) 500 MG tablet Take 500 mg by mouth every 6 (six) hours as needed.     Adapalene-Benzoyl Peroxide 0.1-2.5 % gel Apply 1 application topically at bedtime.      cholestyramine light (PREVALITE) 4 GM/DOSE powder Take 1 packet (4 g total) by mouth daily. 30 packet 1   citalopram (CELEXA) 40 MG tablet Take 40 mg by mouth at bedtime.     clonazePAM (KLONOPIN) 1  MG tablet TAKE 1 TO 1 & 1/2 TABLETS TWICE DAILY AS NEEDED  2   diphenoxylate-atropine (LOMOTIL) 2.5-0.025 MG tablet Take 1 tablet by mouth 4 (four) times daily. 120 tablet 0   fluticasone (FLONASE) 50 MCG/ACT nasal spray Place 1 spray into both nostrils daily.      omeprazole (PRILOSEC) 40 MG capsule TAKE ONE CAPSULE BY MOUTH TWICE DAILY. TAKE BEFORE A MEAL. 60 capsule 5   ondansetron (ZOFRAN) 4 MG tablet TAKE 1 TO 2 TABLETS BY MOUTH EVERY 8 HOURS AS NEEDED FOR NAUSEA 60 tablet 0   STELARA 90 MG/ML SOSY injection MAINTENANCE: INJECT 1 SYRINGE SUBCUTANEOUSLY EVERY 8 WEEKS. REFRIGERATE. DO NOT FREEZE. 1 mL 7   Social History   Socioeconomic History   Marital status: Married    Spouse name: Not on file   Number of children: 2   Years of education: Not on file   Highest education level: Not on file  Occupational History   Occupation: pharmacy tech  Tobacco Use   Smoking status: Current Every Day Smoker    Packs/day: 0.50    Years: 15.00    Pack years: 7.50    Types: Cigarettes   Smokeless tobacco: Never Used  Scientific laboratory technician Use: Never used  Substance and Sexual Activity   Alcohol use: No   Drug use: No   Sexual activity: Yes    Birth control/protection: Post-menopausal, Surgical  Other Topics Concern   Not on file  Social History Narrative   Not on file   Social Determinants of Health   Financial Resource Strain:    Difficulty of Paying Living Expenses: Not on file  Food Insecurity:    Worried About Charity fundraiser in the Last Year: Not on file   [employer] of Food in the Last Year: Not on file  Transportation Needs:    Lack of Transportation (Medical): Not on file   Lack of Transportation (Non-Medical): Not on file  Physical Activity:    Days of Exercise per Week: Not on file   Minutes of Exercise per Session: Not on file  Stress:    Feeling of Stress : Not on file  Social Connections:    Frequency of Communication with Friends and  Family: Not on file   Frequency of Social Gatherings with Friends and Family: Not on file   Attends Religious Services: Not on file   Active Member of Clubs or Organizations: Not on file   Attends Archivist Meetings: Not on file   Marital Status: Not on file  Intimate Partner Violence:    Fear of Current or Ex-Partner: Not on file   Emotionally Abused: Not on file   Physically Abused: Not on file   Sexually Abused: Not on file   Family History  Problem Relation Age of Onset   Prostate cancer  Father    Diverticulitis Mother        complicated, colostomy bag temporarily   Irritable bowel syndrome Mother    Inflammatory bowel disease Neg Hx    Colon cancer Neg Hx    Stomach cancer Neg Hx     OBJECTIVE:  Vitals:   01/10/20 1232  BP: (!) 144/99  Pulse: 74  Resp: 19  Temp: 98.7 F (37.1 C)  SpO2: 97%     General appearance: alert; appears fatigued, but nontoxic; speaking in full sentences and tolerating own secretions HEENT: NCAT; Ears: EACs clear, TMs pearly gray; Eyes: PERRL.  EOM grossly intact. Nose: nares patent without rhinorrhea, Throat: oropharynx clear, tonsils non erythematous or enlarged, uvula midline  Neck: supple without LAD Lungs: unlabored respirations, symmetrical air entry; cough: moderate; no respiratory distress; diffuse wheezes heard throughout bilateral lungs Heart: regular rate and rhythm.  Skin: warm and dry Psychological: alert and cooperative; normal mood and affect   ASSESSMENT & PLAN:  1. Encounter by telehealth for suspected COVID-19   2. Viral URI   3. Suspected COVID-19 virus infection   4. Acute bronchitis, unspecified organism     Meds ordered this encounter  Medications   benzonatate (TESSALON) 200 MG capsule    Sig: Take 1 capsule (200 mg total) by mouth 3 (three) times daily as needed for up to 7 days for cough.    Dispense:  20 capsule    Refill:  0    Order Specific Question:   Supervising Provider     Answer:   Raylene Everts [[id number]]   predniSONE (STERAPRED UNI-PAK 21 TAB) 10 MG (21) TBPK tablet    Sig: Take by mouth daily. Take 6 tabs by mouth daily  for 2 days, then 5 tabs for 2 days, then 4 tabs for 2 days, then 3 tabs for 2 days, 2 tabs for 2 days, then 1 tab by mouth daily for 2 days    Dispense:  42 tablet    Refill:  0    Order Specific Question:   Supervising Provider    Answer:   Raylene Everts [[id number]]   dexamethasone (DECADRON) injection 10 mg   albuterol (VENTOLIN HFA) 108 (90 Base) MCG/ACT inhaler 2 puff   COVID testing ordered.  It will take between 5-7 days for test results.  Someone will contact you regarding abnormal results.    In the meantime: You should remain isolated in your home for 10 days from symptom onset AND greater than 72 hours after symptoms resolution (absence of fever without the use of fever-reducing medication and improvement in respiratory symptoms), whichever is longer Get plenty of rest and push fluids Use OTC zyrtec for nasal congestion, runny nose, and/or sore throat Use OTC flonase for nasal congestion and runny nose Use medications daily for symptom relief Use OTC medications like ibuprofen or tylenol as needed fever or pain Call or go to the ED if you have any new or worsening symptoms such as fever, cough, shortness of breath, chest tightness, chest pain, turning blue, changes in mental status, etc...   Prednisone prescribed Inhaler and decadron given in office Tessalon for cough  Reviewed expectations re: course of current medical issues. Questions answered. Outlined signs and symptoms indicating need for more acute intervention. Patient verbalized understanding. After Visit Summary given.         Lestine Box, PA-C 01/10/20 1251

## 2020-01-10 NOTE — ED Triage Notes (Signed)
Pt reports doing a lot of cleaning over the weekend being exposed to dust. Reports since then she has had a sore throat, nasal congestion, chest congestion, and runny nose. Pt had a negative rapid test.

## 2020-01-13 LAB — NOVEL CORONAVIRUS, NAA: SARS-CoV-2, NAA: NOT DETECTED

## 2020-01-31 ENCOUNTER — Other Ambulatory Visit: Payer: Self-pay | Admitting: Internal Medicine

## 2020-02-05 ENCOUNTER — Telehealth: Payer: Self-pay | Admitting: Internal Medicine

## 2020-02-05 NOTE — Telephone Encounter (Signed)
Patient needs an office visit first:    Refused   Disp Refills Start End  diphenoxylate-atropine (LOMOTIL) 2.5-0.025 MG tablet [Pharmacy Med Name: diphenoxylate-atropine 2.5 mg-0.025 mg tablet] 120 tablet 0 01/31/2020   Sig:  TAKE ONE TABLET BY MOUTH FOUR TIMES DAILY  Class:  Normal  DAW:  No  Reason for Refusal:  Patient needs an appointment  Refused By:  Larina Bras, CMA   08/13/19 office note indictes 6 month follow up needed:  Assessment and Recommendations:  1.Ileal Crohn's disease with history of stricture diagnosis 2015, status post ileocecectomy November 2018on [facility].  -Continue Stelara injections every 8 weeks CBC, CMP, CRP, Vitamin D, iron, ferritin, B12 and folate -Dr.Pyrtle to verify next colonoscopy due date  -Follow up in the office in 6 months

## 2020-02-05 NOTE — Telephone Encounter (Signed)
Hi Dot!!  Patient follow up appt scheduled 02/27/20 at 900AM.  Thank you!:

## 2020-02-26 NOTE — Progress Notes (Deleted)
02/26/2020 Renee Atkinson [id number] 07-10-1965   Chief Complaint:  History of Present Illness: Renee Atkinson. Renee Atkinson is a 54 year old female with a past medical history of stricturing ileal Crohn's disease diagnosed in 2015 status post ileocecectomy in November 2018 with Dr. 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 She is followed by Dr. [name]. I last saw her in the office on 08/13/2019. At that time her Crohn's disease was stable on Stelara injections Q 8 weeks.   She infrequently takes Marlou Starks if she has diarrhea and she requests a refill of this medication  omeprazole 40 mg daily  She went to the ED 01/10/2020 for URI/bronchitis treated with Prednisone.   She received her 2 Covid vaccinations February and March 2021.  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  Biopsies of the ascending colon showed benign lymphoid aggregate. No evidence of colitis.  She was advised to repeat a colonoscopy in 10 years, sooner if symptoms warrant.    She underwent an EGD on the same date which identified a pyloric ulcer, biopsies showed focally active gastritis without evidence of Helicobacter pylori.  A repeat EGD was done 04/28/2016 which showed a widely patent Schatzki's ring, gastric stenosis identified at the pylorus, the previously seen ulceration was absent.  She underwent an upper GI series on 05/16/2016 which identified a peripyloric high-grade stricture and irregularity of the TI consistent with Crohn's disease.  CBC Latest Ref Rng & Units 08/13/2019 11/29/2018 03/30/2018  WBC 4.0 - 10.5 K/uL 8.6 7.7 12.1(H)  Hemoglobin 12.0 - 15.0 g/dL 14.2 14.4 14.8  Hematocrit 36 - 46 % 41.6 42.7 43.9    Platelets 150 - 400 K/uL 223.0 202.0 245.0    CMP Latest Ref Rng & Units 08/13/2019 11/29/2018 03/30/2018  Glucose 70 - 99 mg/dL 83 74 100(H)  BUN 6 - 23 mg/dL 8 11 14   Creatinine 0.40 - 1.20 mg/dL 0.83 0.91 0.92  Sodium 135 - 145 mEq/L 137 142 144  Potassium 3.5 - 5.1 mEq/L 3.7 3.4(L) 3.5  Chloride 96 - 112 mEq/L 103 106 106  CO2 19 - 32 mEq/L 30 32 30  Calcium 8.4 - 10.5 mg/dL 9.5 9.2 9.6  Total Protein 6.0 - 8.3 g/dL 6.4 5.8(L) 6.5  Total Bilirubin 0.2 - 1.2 mg/dL 0.6 0.4 0.4  Alkaline Phos 39 - 117 U/L 44 44 43  AST 0 - 37 U/L 13 14 13   ALT 0 - 35 U/L 10 13 18       Current Medications, Allergies, Past Medical History, Past Surgical History, Family History and Social History were reviewed in [employer] record.   Review of Systems:   Constitutional: Negative for fever, sweats, chills or weight loss.  Respiratory: Negative for shortness of breath.   Cardiovascular: Negative for chest pain, palpitations and leg swelling.  Gastrointestinal: See HPI.  Musculoskeletal: Negative for back pain or muscle aches.  Neurological: Negative for dizziness, headaches or paresthesias.    Physical Exam: There were no vitals taken for this visit. General: Well developed, w   ***female in no acute distress. Head: Normocephalic and atraumatic. Eyes: No scleral icterus. Conjunctiva pink . Ears: Normal auditory acuity. Mouth: Dentition intact. No ulcers or lesions.  Lungs: Clear throughout to  auscultation. Heart: Regular rate and rhythm, no murmur. Abdomen: Soft, nontender and nondistended. No masses or hepatomegaly. Normal bowel sounds x 4 quadrants.  Rectal: *** Musculoskeletal: Symmetrical with no gross deformities. Extremities: No edema. Neurological: Alert oriented x 4. No focal deficits.  Psychological: Alert and cooperative. Normal mood and affect  Assessment and Recommendations:  1.Ileal Crohn's disease with history of stricture diagnosis 2015, status post  ileocecectomy November 2018on Stelera  2.  History of pyloric stenosis, asymptomatic -May need eventual surgical consultation

## 2020-02-27 ENCOUNTER — Ambulatory Visit: Payer: BC Managed Care – PPO | Admitting: Nurse Practitioner

## 2020-03-05 ENCOUNTER — Encounter: Payer: Self-pay | Admitting: Nurse Practitioner

## 2020-03-05 ENCOUNTER — Ambulatory Visit: Payer: BC Managed Care – PPO | Admitting: Nurse Practitioner

## 2020-03-05 VITALS — BP 120/84 | HR 82 | Ht 63.0 in | Wt 143.5 lb

## 2020-03-05 DIAGNOSIS — K5000 Crohn's disease of small intestine without complications: Secondary | ICD-10-CM

## 2020-03-05 MED ORDER — DIPHENOXYLATE-ATROPINE 2.5-0.025 MG PO TABS: 1.0000 | ORAL_TABLET | Freq: Four times a day (QID) | ORAL | 2 refills | Status: DC

## 2020-03-05 MED ORDER — CHOLESTYRAMINE LIGHT 4 GM/DOSE PO POWD: 4.0000 g | Freq: Every day | ORAL | 3 refills | Status: DC

## 2020-03-05 NOTE — Progress Notes (Signed)
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 She presents today for Crohn's follow-up and to refill her Questran and Lomotil prescriptions. She is followed by Dr. [name].  She reports feeling quite well.  She denies having any nausea or vomiting.  No upper or lower abdominal pain.  She typically passes a normal formed brown bowel movement every other day.  However, if she is stressed or becomes nervous she passes looser stools.  No rectal bleeding or melena.  She takes Questran 4 g daily which has significantly improved her bowel pattern.  She takes Lomotil once or twice weekly at this point.  Her appetite is good and her weight is stable.  She received 2 Covid vaccinations and a booster.  She is up-to-date with her known vaccination and she received her annual flu shot.  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  She is due for repeat colonoscopy 01/2026, earlier if warranted as previously verified by Dr. [name].  No complaints today.  Labs 08/13/2019: WBC 8.6.  Hemoglobin 14.2.  Hematocrit 41.6.  Platelet 223.  BUN 8.  Creatinine 0.83.  Alk phos 44.  AST 13.  ALT 10.  Total bili 0.6.  Iron 62.  Ferritin 75.6.  CRP less than 1.  Vitamin D 32.13.  Vitamin B12 570.  Upper GI series 05/16/2016: 1. Peripyloric high-grade stricture, as on recent endoscopy. 2. Slight irregularity of the terminal ileum, consistent with the given history  of Crohn disease. 3. Failure to adequately distend the ileocecal junction. Difficult to exclude a stricture.  EGD 04/28/2016:  - Normal mucosa was found in the entire esophagus. - Widely patent Schatzki ring. - Gastric stenosis was found at the pylorus unable to be traversed with the standard adult upper endoscope. The previously seen pyloric ulcer was not present.   EGD 02/08/2016: - Widely patent Schatzki ring. - 2 cm hiatal hernia. - Gastritis in the pre-pyloric antrum with pyloric channel ulcer causing narrowing at the pylorus (unable to traverse today).  Biopsies showed benign reactive antral gastric mucosa, chronic active gastritis without evidence of Helicobacter pylori.  No dysplasia.  Colonoscopy 02/08/2016: - Crohn's disease with ileitis. Biopsied. 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 - Mild diverticulosis in the sigmoid colon. - Normal mucosa in the entire examined colon without evidence of colitis. 1. Surgical [P], pylorus/pyloric channel ulcer - BENIGN REACTIVE ANTRAL GASTRIC MUCOSA SHOWING CHRONIC, FOCALLY ACTIVE GASTRITIS. - WARTHIN-STARRY STAIN IS NEGATIVE FOR HELICOBACTER PYLORI. - NO INTESTINAL METAPLASIA, DYSPLASIA, OR MALIGNANCY. - SEE COMMENT. 2. Surgical [P], terminal ileum ulcer - BENIGN SMALL BOWEL MUCOSA WITH ACTIVE ILEITIS. - NO DYSPLASIA OR MALIGNANCY IDENTIFIED. - SEE COMMENT. 3. Surgical [P], ascending - BENIGN POLYPOID COLORECTAL MUCOSA WITH ASSOCIATED PROMINENT BENIGN LYMPHOID AGGREGATE. - NO EVIDENCE OF SIGNIFICANT INFLAMMATION, DYSPLASIA OR MALIGNANCY. - SEE COMMENT.   Current Outpatient Medications on File Prior to Visit  Medication Sig Dispense Refill  . acetaminophen (TYLENOL) 500 MG tablet Take 500 mg by mouth every 6 (  six) hours as needed.    . clonazePAM (KLONOPIN) 1 MG tablet TAKE 1 TO 1 & 1/2 TABLETS TWICE DAILY AS NEEDED  2  . fluticasone (FLONASE) 50 MCG/ACT nasal spray Place 1 spray into both nostrils daily.     .  ondansetron (ZOFRAN) 4 MG tablet TAKE 1 TO 2 TABLETS BY MOUTH EVERY 8 HOURS AS NEEDED FOR NAUSEA 60 tablet 0  . pantoprazole (PROTONIX) 40 MG tablet Take 40 mg by mouth daily.    . STELARA 90 MG/ML SOSY injection MAINTENANCE: INJECT 1 SYRINGE SUBCUTANEOUSLY EVERY 8 WEEKS. REFRIGERATE. DO NOT FREEZE. 1 mL 7  . venlafaxine (EFFEXOR) 100 MG tablet Take 100 mg by mouth 2 (two) times daily.     Current Facility-Administered Medications on File Prior to Visit  Medication Dose Route Frequency Provider Last Rate Last Admin  . 0.9 %  sodium chloride infusion  500 mL Intravenous Continuous Pyrtle, Lajuan Lines, MD      . 0.9 %  sodium chloride infusion  500 mL Intravenous Continuous Pyrtle, Lajuan Lines, MD      . acetaminophen (TYLENOL) tablet 650 mg  650 mg Oral Once Jerene Bears, MD      . loratadine (CLARITIN) tablet 10 mg  10 mg Oral Once Pyrtle, Lajuan Lines, MD       Allergies  Allergen Reactions  . Dilaudid [Hydromorphone Hcl] Other (See Comments)    Severe headaches  . Humira [Adalimumab]     FEVER, MYALGIAS, NAUSEA, VOMITING  . Morphine And Related Nausea And Vomiting  . Benadryl [Diphenhydramine] Anxiety    Restless, jittery, inability to be still  . Remicade [Infliximab] Rash    Current Medications, Allergies, Past Medical History, Past Surgical History, Family History and Social History were reviewed in [employer] record.   Review of Systems:   Constitutional: Negative for fever, sweats, chills or weight loss.  Respiratory: Negative for shortness of breath.   Cardiovascular: Negative for chest pain, palpitations and leg swelling.  Gastrointestinal: See HPI.  Musculoskeletal: Negative for back pain or muscle aches.  Neurological: Negative for dizziness, headaches or paresthesias.    Physical Exam: BP 120/84   Pulse 82   Ht 5' 3"  (1.6 m)   Wt 143 lb 8 oz (65.1 kg)   BMI 25.42 kg/m  General: Well developed 54 year old female in no acute distress. Head: Normocephalic  and atraumatic. Eyes: No scleral icterus. Conjunctiva pink . Lungs: Clear throughout to auscultation. Heart: Regular rate and rhythm, no murmur. Abdomen: Soft, nontender and nondistended. No masses or hepatomegaly. Normal bowel sounds x 4 quadrants.  Rectal: Deferred. Midline abdominal scar intact.  Musculoskeletal: Symmetrical with no gross deformities. Extremities: No edema. Neurological: Alert oriented x 4. No focal deficits.  Psychological: Alert and cooperative. Normal mood and affect  Assessment and Recommendations:  1.Ileal Crohn's disease with history of stricture diagnosed in 2015, s/p ileocecectomy 11/2018on Stelera since 05/2017.   -Continue Stelara injections every 8 weeks -Continue Cholestyramine 4 g p.o. daily -Continue Lomotil 1 p.o. qid as needed -Next colonoscopy due 01/2026, earlier if symptoms warrant -Follow-up in the office in 1 year and as needed -Patient to contact her office if she develops any infectious process  2. IBS -See plan in # 1  3.  History of pyloric stenosis, asymptomatic -Continue pantoprazole 40 mg daily

## 2020-03-05 NOTE — Patient Instructions (Signed)
We have sent the following medications to your pharmacy for you to pick up at your convenience: Questran and Lomotil.   You will be due for a recall colonoscopy in 01/2026. We will send you a reminder in the mail when it gets closer to that time.

## 2020-03-09 NOTE — Progress Notes (Signed)
Addendum: Reviewed and agree with assessment and management plan. Stace Peace M, MD  

## 2020-04-02 ENCOUNTER — Ambulatory Visit: Payer: BC Managed Care – PPO | Admitting: Nurse Practitioner

## 2020-04-13 ENCOUNTER — Telehealth: Payer: Self-pay

## 2020-04-13 NOTE — Telephone Encounter (Signed)
Pt calling, states she needs a new copay assistance card for her Stelara. Form faxed to Lauretta Grill for new copay assistance card for Stelara.

## 2020-07-13 ENCOUNTER — Other Ambulatory Visit: Payer: Self-pay | Admitting: Family Medicine

## 2020-10-01 ENCOUNTER — Other Ambulatory Visit: Payer: Self-pay | Admitting: Internal Medicine

## 2020-10-09 ENCOUNTER — Other Ambulatory Visit: Payer: Self-pay | Admitting: Nurse Practitioner

## 2021-06-15 ENCOUNTER — Ambulatory Visit: Payer: BC Managed Care – PPO | Admitting: Dermatology

## 2021-08-16 ENCOUNTER — Telehealth: Payer: Self-pay

## 2021-08-16 NOTE — Telephone Encounter (Signed)
Received prior auth information for pts stelara. Pt has not been seen since 03/14/20. Pt needs an OV before PA form can be filled out. Left message for pt to call back. ?

## 2021-08-18 NOTE — Telephone Encounter (Signed)
Pts appt moved up to 08/23/21 at 1:30pm to get prior auth for stelara done sooner. Pt aware. ?

## 2021-08-20 ENCOUNTER — Other Ambulatory Visit: Payer: Self-pay

## 2021-08-23 ENCOUNTER — Ambulatory Visit: Payer: BC Managed Care – PPO | Admitting: Nurse Practitioner

## 2021-08-31 ENCOUNTER — Ambulatory Visit: Payer: BC Managed Care – PPO | Admitting: Nurse Practitioner

## 2021-09-06 NOTE — Progress Notes (Signed)
? ? ?09/07/2021 ?Renee Atkinson ?[id number] ?25-Dec-1965 ? ?Referring provider: Deloria Lair., MD ?Primary GI doctor: Dr. [name] ? ?ASSESSMENT AND PLAN:  ? ?Crohn's disease of small intestine without complication ([place]) ?-     CBC with Differential/Platelet; Future ?-     Basic metabolic panel; Future ?-     Hepatic function panel; Future ?-     C-reactive protein; Future ?-     Sedimentation rate; Future ?-     QuantiFERON-TB Gold Plus; Future ?Colonoscopy 0/16/2017, not on medication at that time? 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 ?CT AB and pelvis 07/2016 showed mid through distal ileum wall thickening then had ileocectomy 02/2017 ?Has been off stelera due to insurance x march 9 th (was due May 4th) with 3 weeks new constipation-  ?No AB pain, no signs of obstruction.  ?Get inflammatory labs if elevated can consider low dose prednisone but has only missed 10 days of stelera ?Pending labs and clinical course, can consider repeat CTE versus need for repeat colon ?Will try to get PA for medication and back on this, patient was having benefit.  ? ?Will check nutritional monitoring with surgical history/crohn's: Iron, ferritin, iron TIBC, vitamin B12, folate and vitamin D level.   ?-Patient is instructed to avoid anti-inflammatories ?-Patient up-to-date on influenza, pneumococcal and COVID-19 vaccinations, shingles. ? ?Chronic idiopathic constipation ?Large volume stool in vault ?Negative FOBT, no AB pain ?Off questran.  ?possible component of pelvis floor dysfunction with history and symptoms.  ?Will treat with miralax/fiber, squatty potty. ?Can refer to pelvic floor PT ?Can consider anal manometry.  ? ?Gastroesophageal reflux disease without esophagitis ?she reports symptoms are currently well controlled, and denies breakthrough reflux, burning in chest, hoarseness or cough. ?Lifestyle changes discussed, avoid NSAIDS ?Continue current medications ? ?Medication management ?-      Vitamin B12; Future ?-     IBC panel; Future ?-     Folate; Future ?-     VITAMIN D 25 Hydroxy (Vit-D Deficiency, Fractures); Future ? ?Screening-pulmonary TB ?-     QuantiFERON-TB Gold Plus; Future ? ?Vitamin D deficiency ?-     VITAMIN D 25 Hydroxy (Vit-D Deficiency, Fractures); Future ? ?B12 deficiency ?-     Vitamin B12; Future ? ? ?History of Present Illness:  ?56 y.o. female presents for evaluation of ileal Crohn's disease with history of stricturing status post ileocecectomy November 2018.Marland Kitchen ?Patient with past medical history of malignant melanoma, peptic ulcer disease with pyloric stenosis, duodenal stricture, status post serial dilatation at [facility]. ?Last seen in the office on 03/05/2020.  ? ?IBD history:Diagnosed 2015  ?Surgical history: Ileocecectomy in 02/2017 secondary to strictures.  ?Current medications and last dose:  ?Prior medications: ?-Stelara since 05/2017 q 8 weeks, was supposed to have May 4th but could not get medication, last one was 8 weeks prior to that, needs PA.  ?-Questran 4 g daily as needed, takes 1-2 x a week. Has not been on for 3 weeks.  ?-Lomotil 1-2 times weekly not on lately ?Last colonoscopy: 02/08/2016, not on medication at that time? 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 ?Last small bowel imaging: CT AB and pelvis 07/2016 showed mid through distal ileum wall thickening then had ileocectomy 02/2017 ?Extraintestinal manifestations: The patient has not had any extraintestinal symptoms ? ?Recent labs: ?08/13/2019 CRP <1.0 ?08/13/2019 WBC 8.6 HGB 14.2 MCV 95.2 Platelets 223.0 ?08/13/2019 Iron 62 Ferritin 75.6 B12 570 ?08/13/2019 AST 13 ALT 10 Alkphos 44 TBili 0.6 ?  08/13/2019 VITAMIN D 32.13 ?TB GOLD 11/29/2018 NEGATIVE  ? ?Current History ?She presents here for PA for stelera.  ?Stelera since 05/2017 was supposed to have May 4th but could not get medication, last one was 8 weeks prior to that. ?Can have BM 1-4 times a day, well formed, can be hard  stool, feels she has difficulty getting stool out for 3 weeks. She is afraid she has a stricture, states it feels like she has a band in her rectum.  ?Has been having some BRB on toilet paper. No rectal pain.  ?Denies AB pain, no nausea, vomiting.    ?The patient has not had fever.   ?The patient has not had weight loss.   ?Questran 4 g daily as needed, takes 1-2 x a week. Has not been on for 3 weeks.  ? ?IBD Health Care Maintenance: ?Annual Flu Vaccine - Gets yearly ?Pneumococcal Vaccine if receiving immunosuppression: -  2014 ?TB testing if on anti-TNF, yearly - will get this visit ?Vitamin D screening - will get this visit ?COVID vaccine - up to date ?Shingrix - has had with PCP ? ?Immunization History  ?Administered Date(s) Administered  ? Hep A / Hep B 03/15/2016, 04/26/2016, 09/15/2016  ? Moderna Sars-Covid-2 Vaccination 06/06/2019, 07/04/2019  ? Pneumococcal Polysaccharide-23 04/03/2013, 03/15/2016  ? ?Upper GI series 05/16/2016: ?1. Peripyloric high-grade stricture, as on recent endoscopy. ?2. Slight irregularity of the terminal ileum, consistent with the ?given history of Crohn disease. ?3. Failure to adequately distend the ileocecal junction. Difficult ?to exclude a stricture. ?  ?EGD 04/28/2016:  ?- Normal mucosa was found in the entire esophagus. ?- Widely patent Schatzki ring. ?- Gastric stenosis was found at the pylorus unable to be traversed with the standard adult ?upper endoscope. The previously seen pyloric ulcer was not present.  ?  ?EGD 02/08/2016: ?- Widely patent Schatzki ring. ?- 2 cm hiatal hernia. ?- Gastritis in the pre-pyloric antrum with pyloric channel ulcer causing narrowing at the pylorus (unable to traverse today).  Biopsies showed benign reactive antral gastric mucosa, chronic active gastritis without evidence of Helicobacter pylori.  No dysplasia. ?  ?Colonoscopy 02/08/2016: ?- Crohn's disease with ileitis. Biopsied. 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 ?- Mild  diverticulosis in the sigmoid colon. ?- Normal mucosa in the entire examined colon without evidence of colitis. ?1. Surgical [P], pylorus/pyloric channel ulcer ?- BENIGN REACTIVE ANTRAL GASTRIC MUCOSA SHOWING CHRONIC, FOCALLY ACTIVE GASTRITIS. ?- WARTHIN-STARRY STAIN IS NEGATIVE FOR HELICOBACTER PYLORI. ?- NO INTESTINAL METAPLASIA, DYSPLASIA, OR MALIGNANCY. ?- SEE COMMENT. ?2. Surgical [P], terminal ileum ulcer ?- BENIGN SMALL BOWEL MUCOSA WITH ACTIVE ILEITIS. ?- NO DYSPLASIA OR MALIGNANCY IDENTIFIED. ?- SEE COMMENT. ?3. Surgical [P], ascending ?- BENIGN POLYPOID COLORECTAL MUCOSA WITH ASSOCIATED PROMINENT BENIGN LYMPHOID AGGREGATE. ?- NO EVIDENCE OF SIGNIFICANT INFLAMMATION, DYSPLASIA OR MALIGNANCY. ?- SEE COMMENT. ? ? ?Current Medications:  ? ? ? ?Current Outpatient Medications (Cardiovascular):  ?  cholestyramine light (PREVALITE) 4 GM/DOSE powder, TAKE FOUR GRAMS ONCE DAILY AS DIRECTED - CONTAINER LASTS APPROXIMATELY 60 DAYS ? ? ?Current Outpatient Medications (Respiratory):  ?  fluticasone (FLONASE) 50 MCG/ACT nasal spray, Place 1 spray into both nostrils daily.  ? ?Current Facility-Administered Medications (Respiratory):  ?  loratadine (CLARITIN) tablet 10 mg ? ?Current Outpatient Medications (Analgesics):  ?  acetaminophen (TYLENOL) 500 MG tablet, Take 500 mg by mouth every 6 (six) hours as needed. ? ?Current Facility-Administered Medications (Analgesics):  ?  acetaminophen (TYLENOL) tablet 650 mg ? ? ? ?Current Outpatient Medications (Other):  ?  clonazePAM (KLONOPIN) 1 MG tablet, TAKE 1 TO 1 & 1/2 TABLETS TWICE DAILY AS NEEDED ?  diphenoxylate-atropine (LOMOTIL) 2.5-0.025 MG tablet, Take 1 tablet by mouth 4 (four) times daily. ?  ondansetron (ZOFRAN) 4 MG tablet, TAKE 1 TO 2 TABLETS BY MOUTH EVERY 8 HOURS AS NEEDED FOR NAUSEA ?  pantoprazole (PROTONIX) 40 MG tablet, Take 40 mg by mouth daily. ?  STELARA 90 MG/ML SOSY injection, MAINTENANCE: INJECT 1 SYRINGE SUBCUTANEOUSLY EVERY 8 WEEKS. REFRIGERATE. DO NOT  FREEZE. ?  traZODone (DESYREL) 150 MG tablet, Take 150 mg by mouth at bedtime. ?  venlafaxine (EFFEXOR) 100 MG tablet, Take 100 mg by mouth 2 (two) times daily. ? ?Current Facility-Administered Medications (Ot

## 2021-09-07 ENCOUNTER — Ambulatory Visit: Payer: BC Managed Care – PPO | Admitting: Physician Assistant

## 2021-09-07 ENCOUNTER — Encounter: Payer: Self-pay | Admitting: Physician Assistant

## 2021-09-07 ENCOUNTER — Other Ambulatory Visit (INDEPENDENT_AMBULATORY_CARE_PROVIDER_SITE_OTHER): Payer: BC Managed Care – PPO

## 2021-09-07 VITALS — BP 98/70 | HR 105 | Ht 64.0 in | Wt 151.4 lb

## 2021-09-07 DIAGNOSIS — E559 Vitamin D deficiency, unspecified: Secondary | ICD-10-CM | POA: Diagnosis not present

## 2021-09-07 DIAGNOSIS — K5000 Crohn's disease of small intestine without complications: Secondary | ICD-10-CM

## 2021-09-07 DIAGNOSIS — Z111 Encounter for screening for respiratory tuberculosis: Secondary | ICD-10-CM | POA: Diagnosis not present

## 2021-09-07 DIAGNOSIS — E538 Deficiency of other specified B group vitamins: Secondary | ICD-10-CM | POA: Diagnosis not present

## 2021-09-07 DIAGNOSIS — Z79899 Other long term (current) drug therapy: Secondary | ICD-10-CM | POA: Diagnosis not present

## 2021-09-07 DIAGNOSIS — K219 Gastro-esophageal reflux disease without esophagitis: Secondary | ICD-10-CM

## 2021-09-07 DIAGNOSIS — K5904 Chronic idiopathic constipation: Secondary | ICD-10-CM

## 2021-09-07 LAB — CBC WITH DIFFERENTIAL/PLATELET
Basophils Absolute: 0.1 10*3/uL (ref 0.0–0.1)
Basophils Relative: 0.6 % (ref 0.0–3.0)
Eosinophils Absolute: 0.4 10*3/uL (ref 0.0–0.7)
Eosinophils Relative: 3.9 % (ref 0.0–5.0)
HCT: 41.3 % (ref 36.0–46.0)
Hemoglobin: 14.1 g/dL (ref 12.0–15.0)
Lymphocytes Relative: 30.6 % (ref 12.0–46.0)
Lymphs Abs: 3 10*3/uL (ref 0.7–4.0)
MCHC: 34.2 g/dL (ref 30.0–36.0)
MCV: 95 fl (ref 78.0–100.0)
Monocytes Absolute: 0.8 10*3/uL (ref 0.1–1.0)
Monocytes Relative: 8.2 % (ref 3.0–12.0)
Neutro Abs: 5.5 10*3/uL (ref 1.4–7.7)
Neutrophils Relative %: 56.7 % (ref 43.0–77.0)
Platelets: 259 10*3/uL (ref 150.0–400.0)
RBC: 4.35 Mil/uL (ref 3.87–5.11)
RDW: 13.2 % (ref 11.5–15.5)
WBC: 9.7 10*3/uL (ref 4.0–10.5)

## 2021-09-07 LAB — IBC PANEL
Iron: 94 ug/dL (ref 42–145)
Saturation Ratios: 26.6 % (ref 20.0–50.0)
TIBC: 352.8 ug/dL (ref 250.0–450.0)
Transferrin: 252 mg/dL (ref 212.0–360.0)

## 2021-09-07 LAB — SEDIMENTATION RATE: Sed Rate: 10 mm/hr (ref 0–30)

## 2021-09-07 LAB — HEPATIC FUNCTION PANEL
ALT: 13 U/L (ref 0–35)
AST: 15 U/L (ref 0–37)
Albumin: 3.8 g/dL (ref 3.5–5.2)
Alkaline Phosphatase: 54 U/L (ref 39–117)
Bilirubin, Direct: 0.1 mg/dL (ref 0.0–0.3)
Total Bilirubin: 0.4 mg/dL (ref 0.2–1.2)
Total Protein: 6.4 g/dL (ref 6.0–8.3)

## 2021-09-07 LAB — VITAMIN D 25 HYDROXY (VIT D DEFICIENCY, FRACTURES): VITD: 23.12 ng/mL — ABNORMAL LOW (ref 30.00–100.00)

## 2021-09-07 LAB — BASIC METABOLIC PANEL
BUN: 11 mg/dL (ref 6–23)
CO2: 29 mEq/L (ref 19–32)
Calcium: 9.6 mg/dL (ref 8.4–10.5)
Chloride: 104 mEq/L (ref 96–112)
Creatinine, Ser: 1.14 mg/dL (ref 0.40–1.20)
GFR: 54.03 mL/min — ABNORMAL LOW (ref >60.00)
Glucose, Bld: 73 mg/dL (ref 70–99)
Potassium: 3.6 mEq/L (ref 3.5–5.1)
Sodium: 141 mEq/L (ref 135–145)

## 2021-09-07 LAB — FOLATE: Folate: 8.8 ng/mL (ref >5.9)

## 2021-09-07 LAB — VITAMIN B12: Vitamin B-12: 518 pg/mL (ref 211–911)

## 2021-09-07 LAB — C-REACTIVE PROTEIN: CRP: <1 mg/dL (ref 0.5–20.0)

## 2021-09-07 MED ORDER — POLYETHYLENE GLYCOL 3350 17 G PO PACK: 17.0000 g | PACK | Freq: Every day | ORAL | 3 refills | Status: AC

## 2021-09-07 NOTE — Patient Instructions (Addendum)
Miralax is an osmotic laxative.  ?It only brings more water into the stool.  ?This is safe to take daily.  ?Can take up to 17 gram of miralax twice a day.  ?Mix with juice or coffee.  ?Start 1 capful at night for 3-4 days and reassess your response in 3-4 days.  ?You can increase and decrease the dose based on your response.  ?Remember, it can take up to 3-4 days to take effect OR for the effects to wear off.  ? ?I often pair this with benefiber in the morning to help assure the stool is not too loose.  ? ?Toileting tips to help with your constipation ?- Drink at least 64-80 ounces of water/liquid per day. ?- Establish a time to try to move your bowels every day.  For many people, this is after a cup of coffee or after a meal such as breakfast. ?- Sit all of the way back on the toilet keeping your back fairly straight and while sitting up, try to rest the tops of your forearms on your upper thighs.   ?- Raising your feet with a step stool/squatty potty can be helpful to improve the angle that allows your stool to pass through the rectum. ?- Relax the rectum feeling it bulge toward the toilet water.  If you feel your rectum raising toward your body, you are contracting rather than relaxing. ?- Breathe in and slowly exhale. "Belly breath" by expanding your belly towards your belly button. Keep belly expanded as you gently direct pressure down and back to the anus.  A low pitched GRRR sound can assist with increasing intra-abdominal pressure.  ?- Repeat 3-4 times. If unsuccessful, contract the pelvic floor to restore normal tone and get off the toilet.  Avoid excessive straining. ?- To reduce excessive wiping by teaching your anus to normally contract, place hands on outer aspect of knees and resist knee movement outward.  Hold 5-10 second then place hands just inside of knees and resist inward movement of knees.  Hold 5 seconds.  Repeat a few times each way. ? ?Health Maintenance in Inflammatory Bowel  Disease ? ?Vaccines ?Inflammatory Bowel Disease (IBD) is often treated with immunomodulatory medications (6-mercaptopurine, azathioprine, methotrexate), biologic therapies (adalimumab, certulizomab, natalizumab, infliximab, vedolizumab) or chronic steroids (at least 83m daily for 3 months), which suppress the immune system. Patients receiving this type of therapy are at higher risk to develop infections. Many infections can be prevented by vaccinations. Patients with IBD should follow the immunization schedule for the general adult population with one exception - patients on immunosuppressive therapy should NOT receive live vaccines.  ? ?Discuss vaccinations with your physician, but in general, IBD patients SHOULD receive the following vaccines: ? ?Hepatitis A ? If not previously vaccinated, this is a 2-dose vaccine.  ? ?Hepatitis B ? If not previously vaccinated, this is a 3-dose vaccine.  ? ?Human papilloma virus (HPV) ? The HPV vaccine is recommended in females aged 9-26. It is a 3-dose vaccine.  ? The HPV vaccine is also now recommended in males aged 13-21. In addition, males that are immunocompromised may get the vaccine up until age 56 It is a 3-dose vaccine.  ? ?Influenza ? All adults should get the influenza vaccine annually. ? IBD patients on immunosuppressive therapy should NOT get the intranasal vaccine, as it is a live vaccine. ? ?Pneumococcal ? All adults 65 years or older should receive the Pneumococcal Conjugate Vaccine (PCV13), followed by the Pneumococcal Polysaccharide Vaccine (PPSV23) at  least 1 year later. ? In addition, patients over the age of 35 on immunosuppressive therapy should receive the PCV13, followed by PPSV23 no sooner than 8 weeks later. A booster PPSV23 is also recommended 5 years later. ? Finally, patients that smoke should get the PPSV23 if not otherwise vaccinated. ? ?Tetanus, diphtheria, pertussis (Tdap/Td) ? All adults should receive a 1-time Tdap. ? A Td booster should be  administered every 10 years.  ? ?Bone Health ?Patients with IBD can have markedly lower bone mineral densities than patients without IBD. Low bone mineral density is associated with fractures. Therefore, screening for bone disease, such as osteoporosis and osteopenia, is important in certain populations. ? The American Gastroenterology Association ([place]) and the [employer] of Gastroenterology [facility]) recommend dual-energy x-ray absorptiometry scanning (DEXA) in postmenopausal women, men over the age of 26, patients with prolonged corticosteroid use (greater than 3 consecutive months or recurrent courses), patients with a personal history of low trauma fracture and patients with hypogonadism.  ? ?Tobacco Cessation ?Smoking worsens Crohn?s disease. In addition, smoking is associated with many known cardiac, pulmonary and oncologic risks.  ? ?If you need help with quitting smoking, please talk to your doctor and call 1-800-QUIT NOW. ? ?Cancer Screening ?IBD patients on immunosuppressive therapy may be more susceptible to certain types of cancer. However, specific evidence is conflicting. Therefore, patients with IBD should undergo age- and sex-specific cancer screening. Please discuss this important issue with your primary care provider. ? ?Colon Cancer ? Patients with either Ulcerative Colitis (UC) or Crohn?s disease involving the colon should have colon cancer screening 8-10 years after initial diagnosis. After that time, the frequency of surveillance colonocoscopy will be determined by your gastroenterologist, but will usually occur every 1-2 years.  ? Patients with IBD and Primary Sclerosing Cholangitis ([place]) need a screening colonoscopy at the time of diagnosis of [place] and annually thereafter.  ? ?References: ?Itzkowitz SH, Present DH. Consensus Conference: Colorectal Cancer Screening and Surveillance in Inflammatory Bowel Disease. Inflamm Bowel Dis 2005;11:314-321.  ? ?Moscandrew M, Mahadevan U, Kane S. General  Health Maintenance in IBD. Inflamm Bowel Dis 2009;15 0413-6438. ? ?Your provider has requested that you go to the basement level for lab work before leaving today. Press "B" on the elevator. The lab is located at the first door on the left as you exit the elevator. ? ?Due to recent changes in healthcare laws, you may see the results of your imaging and laboratory studies on MyChart before your provider has had a chance to review them.  We understand that in some cases there may be results that are confusing or concerning to you. Not all laboratory results come back in the same time frame and the provider may be waiting for multiple results in order to interpret others.  Please give [place] 48 hours in order for your provider to thoroughly review all the results before contacting the office for clarification of your results.  ? ? ?I appreciate the opportunity to care for you. ?Vicie Mutters, PA-C ?

## 2021-09-08 ENCOUNTER — Other Ambulatory Visit: Payer: Self-pay

## 2021-09-08 ENCOUNTER — Telehealth: Payer: Self-pay

## 2021-09-08 DIAGNOSIS — K5000 Crohn's disease of small intestine without complications: Secondary | ICD-10-CM

## 2021-09-08 DIAGNOSIS — K5904 Chronic idiopathic constipation: Secondary | ICD-10-CM

## 2021-09-08 MED ORDER — NA SULFATE-K SULFATE-MG SULF 17.5-3.13-1.6 GM/177ML PO SOLN: 1.0000 | Freq: Once | ORAL | 0 refills | Status: AC

## 2021-09-08 NOTE — Telephone Encounter (Signed)
Spoke with patient regarding lab results & recommendations. She has been scheduled for colon with Dr. [name] on 11/11/21 at 9:30 am. Patient is not on any blood thinners or diabetic. She is aware that she will need a care partner that day to drive her, and stay with her for procedure. Prep has been sent to pharmacy & instructions sent to mychart. Amb ref placed. Patient is aware that we are currently working on prior [place] for [employer].  ?

## 2021-09-10 LAB — QUANTIFERON-TB GOLD PLUS
Mitogen-NIL: >10 IU/mL
NIL: 0.01 IU/mL
QuantiFERON-TB Gold Plus: NEGATIVE
TB1-NIL: 0.01 IU/mL
TB2-NIL: 0 IU/mL

## 2021-09-11 ENCOUNTER — Other Ambulatory Visit (HOSPITAL_COMMUNITY): Payer: Self-pay

## 2021-09-11 ENCOUNTER — Telehealth: Payer: Self-pay | Admitting: Pharmacy Technician

## 2021-09-11 NOTE — Telephone Encounter (Signed)
Patient Advocate Encounter  Received notification from [place] that prior authorization for [facility] 90MG is required.   PA submitted on 5.20.23 Key [id number] Status is pending   [facility] will continue to follow  Luciano Cutter, CPhT Patient Advocate Phone: [phone]

## 2021-09-11 NOTE — Progress Notes (Signed)
PA has been submitted.

## 2021-09-14 ENCOUNTER — Other Ambulatory Visit (HOSPITAL_COMMUNITY): Payer: Self-pay

## 2021-09-14 NOTE — Telephone Encounter (Signed)
Patient Advocate Encounter  Received notification from [facility] regarding a prior authorization for STELARA 26m. Authorization has been APPROVED from 5.20.23 to 5.20.24.   Per test claim, copay for 56 days supply is $200   Authorization # PA# [id number]

## 2021-09-14 NOTE — Telephone Encounter (Signed)
Pt aware Stelara has been approved and she will get the prescription filled.

## 2021-09-26 NOTE — Progress Notes (Signed)
Addendum: Reviewed and agree with assessment and management plan. Would recommend a fecal calprotectin and if elevated repeat CT enterography Sorrel Cassetta, Lajuan Lines, MD

## 2021-10-20 ENCOUNTER — Other Ambulatory Visit: Payer: Self-pay | Admitting: Internal Medicine

## 2021-11-04 ENCOUNTER — Encounter: Payer: Self-pay | Admitting: Internal Medicine

## 2021-11-11 ENCOUNTER — Ambulatory Visit (AMBULATORY_SURGERY_CENTER): Payer: BC Managed Care – PPO | Admitting: Internal Medicine

## 2021-11-11 ENCOUNTER — Encounter: Payer: Self-pay | Admitting: Internal Medicine

## 2021-11-11 VITALS — BP 115/82 | HR 80 | Temp 97.8°F | Resp 16 | Ht 64.0 in | Wt 151.0 lb

## 2021-11-11 DIAGNOSIS — K5000 Crohn's disease of small intestine without complications: Secondary | ICD-10-CM

## 2021-11-11 DIAGNOSIS — K5904 Chronic idiopathic constipation: Secondary | ICD-10-CM

## 2021-11-11 MED ORDER — SODIUM CHLORIDE 0.9 % IV SOLN: 500.0000 mL | Freq: Once | INTRAVENOUS | Status: DC

## 2021-11-11 NOTE — Op Note (Signed)
[facility] Patient Name: Renee Atkinson Procedure Date: 11/11/2021 9:12 AM MRN: [id number] Endoscopist: Jerene Bears , MD Age: 56 Referring MD:  Date of Birth: 1966-01-16 Gender: Female Account #: [id number] Procedure:                Colonoscopy Indications:              Disease activity assessment of Crohn's disease of                            the small bowel with hx of fistula, s/p                            ileocecectomy Nov 2018, last colon Oct 2017; recent                            change in bowel habits with constipation Medicines:                Monitored Anesthesia Care Procedure:                Pre-Anesthesia Assessment:                           - Prior to the procedure, a History and Physical                            was performed, and patient medications and                            allergies were reviewed. The patient's tolerance of                            previous anesthesia was also reviewed. The risks                            and benefits of the procedure and the sedation                            options and risks were discussed with the patient.                            All questions were answered, and informed consent                            was obtained. Prior Anticoagulants: The patient has                            taken no previous anticoagulant or antiplatelet                            agents. ASA Grade Assessment: II - A patient with                            mild systemic disease. After reviewing the risks  and benefits, the patient was deemed in                            satisfactory condition to undergo the procedure.                           After obtaining informed consent, the colonoscope                            was passed under direct vision. Throughout the                            procedure, the patient's blood pressure, pulse, and                            oxygen saturations were  monitored continuously. The                            Olympus PCF-H190DL (#[id number]) Colonoscope was                            introduced through the anus and advanced to the                            terminal ileum. The colonoscopy was performed                            without difficulty. The patient tolerated the                            procedure well. The quality of the bowel                            preparation was good. The terminal ileum, ileocecal                            valve, appendiceal orifice, and rectum were                            photographed. Scope In: 9:22:13 AM Scope Out: 9:41:05 AM Scope Withdrawal Time: 0 hours 13 minutes 52 seconds  Total Procedure Duration: 0 hours 18 minutes 52 seconds  Findings:                 The digital rectal exam was normal.                           There was evidence of a prior end-to-side                            ileo-colonic anastomosis in the ascending colon.                            This was patent and was characterized by healthy  appearing mucosa. The anastomosis was traversed.                           The Simple Endoscopic Score for Crohn's Disease was                            determined based on the endoscopic appearance of                            the mucosa in the following segments:                           - Ileum: Findings include aphthous ulcers less than                            0.5 cm in size, less than 10% ulcerated surfaces,                            less than 50% of surfaces affected and no                            narrowings. Segment score: 3.                           - Right Colon: Findings include no ulcers present,                            no ulcerated surfaces, no affected surfaces and no                            narrowings. Segment score: 0.                           - Transverse Colon: Findings include no ulcers                            present, no  ulcerated surfaces, no affected                            surfaces and no narrowings. Segment score: 0.                           - Left Colon: Findings include no ulcers present,                            no ulcerated surfaces, no affected surfaces and no                            narrowings. Segment score: 0.                           - Rectum: Findings include no ulcers present, no  ulcerated surfaces, no affected surfaces and no                            narrowings. Segment score: 0.                           - Total SES-CD aggregate score: 3. Biopsies were                            taken with a cold forceps for histology.                           The exam was otherwise without abnormality on                            direct and retroflexion views. Complications:            No immediate complications. Estimated Blood Loss:     Estimated blood loss was minimal. Impression:               - Patent end-to-side ileo-colonic anastomosis,                            characterized by healthy appearing mucosa.                           - Simple Endoscopic Score for Crohn's Disease: 3,                            very mild inflammatory changes in the neo-terminal                            ileum (scattered ulcers without narrowing)                            secondary to Crohn's disease. Biopsied. Colon                            normal without evidence of colitis.                           - The examination was otherwise normal on direct                            and retroflexion views. Recommendation:           - Patient has a contact number available for                            emergencies. The signs and symptoms of potential                            delayed complications were discussed with the                            patient. Return to normal activities tomorrow.  Written discharge instructions were provided to the                             patient.                           - Resume previous diet.                           - Continue present medications including Stelara.                            Given very mild nature of findings I would not                            change Crohn's therapy at this time. Continue                            MiraLax for constipation and if not effective                            please call my office.                           - Await pathology results.                           - Repeat colonoscopy is recommended for                            surveillance. The colonoscopy date will be                            determined after pathology results from today's                            exam become available for review. Jerene Bears, MD 11/11/2021 9:49:55 AM This report has been signed electronically.

## 2021-11-11 NOTE — Progress Notes (Signed)
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  The nature of the procedure, as well as the risks, benefits, and alternatives were carefully and thoroughly reviewed with the patient. Ample time for discussion and questions allowed. The patient understood, was satisfied, and agreed to proceed.     HPI: Renee Atkinson is a 56 y.o. female who presents for colonoscopy.  Medical history as below.  Tolerated the prep.  No recent chest pain or shortness of breath.  No abdominal pain today.  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/19/2016   RIGHT LOWER ABDOMEN- TX WITH EXCISION   Peptic ulcer disease    Schatzki's ring    SVD (spontaneous vaginal delivery)    x 2    Past Surgical History:  Procedure Laterality Date   ABDOMINAL HYSTERECTOMY N/A 04/02/2013   Procedure: HYSTERECTOMY ABDOMINAL;  Surgeon: Sharene Butters, MD;  Location: [facility];  Service: Gynecology;  Laterality: N/A;   BIOPSY N/A 05/14/2013   Procedure: BIOPSY;  Surgeon: Danie Binder, MD;  Location: [facility];  Service: Endoscopy;  Laterality: N/A;  biopsies of the ileum, random colon biopsies & rectal biopsies   CHOLECYSTECTOMY     COLONOSCOPY N/A 03/18/2013   Dr. [name]: ileitis, ulcer  at ICV, mild colitis in ascending colon and sigmoid colon, moderate sized internal hemorrhoids. bx inconclusive either related to crohns or NSAIDs   COLONOSCOPY WITH PROPOFOL N/A 05/14/2013   SLF:Non-bleeding mucosal ulceration in the terminal ileum and at the ileocecal valve MOST LIKELY DUE TO CROHN'S DISEASE/Normal colon/Normal mucosa in RECTUM. Bx from TI, chronic active ileitis ?crohn's or NSAIDs. random colon and rectum bx negative.   LAPAROSCOPIC ILEOCECECTOMY  03/09/2017   SALPINGOOPHORECTOMY Bilateral 04/02/2013   Procedure: SALPINGO OOPHORECTOMY;  Surgeon: Sharene Butters, MD;  Location: [facility];  Service: Gynecology;  Laterality: Bilateral;   TUBAL LIGATION      Prior to Admission medications   Medication Sig Start Date End Date Taking? Authorizing Provider  clonazePAM (KLONOPIN) 1 MG tablet TAKE 1 TO 1 & 1/2 TABLETS TWICE DAILY AS NEEDED 08/05/16  Yes [provider]  pantoprazole (PROTONIX) 40 MG tablet Take 40 mg by mouth daily.   Yes [provider]  polyethylene glycol (MIRALAX / GLYCOLAX) 17 g packet Take 17 g by mouth daily. 09/07/21 10/12/22 Yes Vladimir Crofts, PA-C  traZODone (DESYREL) 150 MG tablet Take 150 mg by mouth at bedtime. 07/28/21  Yes [provider]  venlafaxine (EFFEXOR) 100 MG tablet Take 100 mg by mouth 2 (two) times daily.   Yes [provider]  acetaminophen (TYLENOL) 500 MG tablet Take 500 mg by mouth every 6 (six) hours as needed.  [provider]  cholestyramine light (PREVALITE) 4 GM/DOSE powder TAKE FOUR GRAMS ONCE DAILY AS DIRECTED - CONTAINER LASTS APPROXIMATELY 60 DAYS 10/09/20   Noralyn Pick, NP  diphenoxylate-atropine (LOMOTIL) 2.5-0.025 MG tablet Take 1 tablet by mouth 4 (four) times daily. 03/05/20   Noralyn Pick, NP  fluticasone (FLONASE) 50 MCG/ACT nasal spray Place 1 spray into both nostrils daily.  07/20/16   [provider]  ondansetron (ZOFRAN) 4 MG tablet TAKE 1 TO 2  TABLETS BY MOUTH EVERY 8 HOURS AS NEEDED FOR NAUSEA 11/15/19   Aamilah Augenstein, Lajuan Lines, MD  STELARA 90 MG/ML SOSY injection MAINTENANCE: INJECT 1 SYRINGE SUBCUTANEOUSLY EVERY 8 WEEKS. REFRIGERATE. DO NOT FREEZE. 10/20/21   Neythan Kozlov, Lajuan Lines, MD    Current Outpatient Medications  Medication Sig Dispense Refill   clonazePAM (KLONOPIN) 1 MG tablet TAKE 1 TO 1 & 1/2 TABLETS TWICE DAILY AS NEEDED  2   pantoprazole (PROTONIX) 40 MG tablet Take 40 mg by mouth daily.     polyethylene glycol (MIRALAX / GLYCOLAX) 17 g packet Take 17 g by mouth daily. 100 packet 3   traZODone (DESYREL) 150 MG tablet Take 150 mg by mouth at bedtime.     venlafaxine (EFFEXOR) 100 MG tablet Take 100 mg by mouth 2 (two) times daily.     acetaminophen (TYLENOL) 500 MG tablet Take 500 mg by mouth every 6 (six) hours as needed.     cholestyramine light (PREVALITE) 4 GM/DOSE powder TAKE FOUR GRAMS ONCE DAILY AS DIRECTED - CONTAINER LASTS APPROXIMATELY 60 DAYS 231 g 3   diphenoxylate-atropine (LOMOTIL) 2.5-0.025 MG tablet Take 1 tablet by mouth 4 (four) times daily. 120 tablet 2   fluticasone (FLONASE) 50 MCG/ACT nasal spray Place 1 spray into both nostrils daily.      ondansetron (ZOFRAN) 4 MG tablet TAKE 1 TO 2 TABLETS BY MOUTH EVERY 8 HOURS AS NEEDED FOR NAUSEA 60 tablet 0   STELARA 90 MG/ML SOSY injection MAINTENANCE: INJECT 1 SYRINGE SUBCUTANEOUSLY EVERY 8 WEEKS. REFRIGERATE. DO NOT FREEZE. 1 mL 8   Current Facility-Administered Medications  Medication Dose Route Frequency Provider Last Rate Last Admin   0.9 %  sodium chloride infusion  500 mL Intravenous Continuous Hellon Vaccarella, Lajuan Lines, MD       0.9 %  sodium chloride infusion  500 mL Intravenous Continuous Elyanah Farino, Lajuan Lines, MD       0.9 %  sodium chloride infusion  500 mL Intravenous Once Jahyra Sukup, Lajuan Lines, MD       acetaminophen (TYLENOL) tablet 650 mg  650 mg Oral Once Jerene Bears, MD       loratadine (CLARITIN) tablet 10 mg  10 mg Oral Once Jerene Bears, MD        Allergies as of 11/11/2021 -  Review Complete 11/11/2021  Allergen Reaction Noted   Dilaudid [hydromorphone hcl] Other (See Comments) 03/13/2013   Humira [adalimumab]  11/07/2013   Morphine and related Nausea And Vomiting 02/14/2013   Benadryl [diphenhydramine] Anxiety 05/03/2016   Remicade [infliximab] Rash 06/03/2016    Family History  Problem Relation Age of Onset   Prostate cancer Father    Diverticulitis Mother        complicated, colostomy bag temporarily   Irritable bowel syndrome Mother    Inflammatory bowel disease Neg Hx    Colon cancer Neg Hx    Stomach cancer Neg Hx     Social History   Socioeconomic History   Marital status: Married  Spouse name: Not on file   Number of children: 2   Years of education: Not on file   Highest education level: Not on file  Occupational History   Occupation: pharmacy tech  Tobacco Use   Smoking status: Former    Packs/day: 0.50    Years: 15.00    Total pack years: 7.50    Types: Cigarettes   Smokeless tobacco: Never  Vaping Use   Vaping Use: Never used  Substance and Sexual Activity   Alcohol use: No   Drug use: No   Sexual activity: Yes    Birth control/protection: Post-menopausal, Surgical  Other Topics Concern   Not on file  Social History Narrative   Not on file   Social Determinants of Health   Financial Resource Strain: Not on file  Food Insecurity: Not on file  Transportation Needs: Not on file  Physical Activity: Not on file  Stress: Not on file  Social Connections: Not on file  Intimate Partner Violence: Not on file    Physical Exam: Vital signs in last 24 hours: @BP  103/69   Pulse 91   Temp 97.8 F (36.6 C)   Ht 5' 4"  (1.626 m)   Wt 151 lb (68.5 kg)   SpO2 100%   BMI 25.92 kg/m  GEN: NAD EYE: Sclerae anicteric ENT: MMM CV: Non-tachycardic Pulm: CTA b/l GI: Soft, NT/ND NEURO:  Alert & Oriented x 3   Zenovia Jarred, MD [facility]  11/11/2021 9:09 AM

## 2021-11-11 NOTE — Progress Notes (Signed)
Vital signs checked by:DT  The medical and surgical history was reviewed and verified with the patient.

## 2021-11-11 NOTE — Progress Notes (Signed)
PT taken to PACU. Monitors in place. VSS. Report given to RN. 

## 2021-11-11 NOTE — Progress Notes (Signed)
Called to room to assist during endoscopic procedure.  Patient ID and intended procedure confirmed with present staff. Received instructions for my participation in the procedure from the performing physician.  

## 2021-11-11 NOTE — Patient Instructions (Signed)
Please read handouts provided. Continue present medications, including Stelara. Continue Miralax for constipation and if not effective please call Dr. Vena Rua office. Await pathology results.  YOU HAD AN ENDOSCOPIC PROCEDURE TODAY AT [facility]:   Refer to the procedure report that was given to you for any specific questions about what was found during the examination.  If the procedure report does not answer your questions, please call your gastroenterologist to clarify.  If you requested that your care partner not be given the details of your procedure findings, then the procedure report has been included in a sealed envelope for you to review at your convenience later.  YOU SHOULD EXPECT: Some feelings of bloating in the abdomen. Passage of more gas than usual.  Walking can help get rid of the air that was put into your GI tract during the procedure and reduce the bloating. If you had a lower endoscopy (such as a colonoscopy or flexible sigmoidoscopy) you may notice spotting of blood in your stool or on the toilet paper. If you underwent a bowel prep for your procedure, you may not have a normal bowel movement for a few days.  Please Note:  You might notice some irritation and congestion in your nose or some drainage.  This is from the oxygen used during your procedure.  There is no need for concern and it should clear up in a day or so.  SYMPTOMS TO REPORT IMMEDIATELY:  Following lower endoscopy (colonoscopy or flexible sigmoidoscopy):  Excessive amounts of blood in the stool  Significant tenderness or worsening of abdominal pains  Swelling of the abdomen that is new, acute  Fever of 100F or higher  For urgent or emergent issues, a gastroenterologist can be reached at any hour by calling [phone]. Do not use MyChart messaging for urgent concerns.    DIET:  We do recommend a small meal at first, but then you may proceed to your regular diet.  Drink plenty of fluids  but you should avoid alcoholic beverages for 24 hours.  ACTIVITY:  You should plan to take it easy for the rest of today and you should NOT DRIVE or use heavy machinery until tomorrow (because of the sedation medicines used during the test).    FOLLOW UP: Our staff will call the number listed on your records the next business day following your procedure.  We will call around 7:15- 8:00 am to check on you and address any questions or concerns that you may have regarding the information given to you following your procedure. If we do not reach you, we will leave a message.  If you develop any symptoms (ie: fever, flu-like symptoms, shortness of breath, cough etc.) before then, please call [phone].  If you test positive for Covid 19 in the 2 weeks post procedure, please call and report this information to [place].    If any biopsies were taken you will be contacted by phone or by letter within the next 1-3 weeks.  Please call us at [phone] if you have not heard about the biopsies in 3 weeks.    SIGNATURES/CONFIDENTIALITY: You and/or your care partner have signed paperwork which will be entered into your electronic medical record.  These signatures attest to the fact that that the information above on your After Visit Summary has been reviewed and is understood.  Full responsibility of the confidentiality of this discharge information lies with you and/or your care-partner.

## 2021-11-12 ENCOUNTER — Telehealth: Payer: Self-pay | Admitting: *Deleted

## 2021-11-12 NOTE — Telephone Encounter (Signed)
  Follow up Call-     11/11/2021    8:41 AM  Call back number  Post procedure Call Back phone  # [phone]  Permission to leave phone message Yes     Patient questions: Message left to call us if necessary.

## 2021-11-15 ENCOUNTER — Encounter: Payer: Self-pay | Admitting: Internal Medicine

## 2022-06-03 ENCOUNTER — Other Ambulatory Visit: Payer: Self-pay | Admitting: Nurse Practitioner

## 2022-09-09 ENCOUNTER — Other Ambulatory Visit (HOSPITAL_COMMUNITY): Payer: Self-pay

## 2022-09-10 ENCOUNTER — Telehealth: Payer: Self-pay | Admitting: Pharmacy Technician

## 2022-09-10 NOTE — Telephone Encounter (Signed)
Patient Advocate Encounter  Received notification from [facility] that prior authorization for [facility] 90MG  is required.   PA submitted on 5.18.24 VIA FAX FAX# [phone] Status is pending

## 2022-09-13 NOTE — Telephone Encounter (Signed)
PA has been APPROVED from 09/11/2022-09/11/2023  Approval letter attached in patients documents

## 2022-11-23 ENCOUNTER — Other Ambulatory Visit: Payer: Self-pay | Admitting: Internal Medicine

## 2023-06-28 ENCOUNTER — Other Ambulatory Visit: Payer: Self-pay | Admitting: Physician Assistant

## 2023-08-01 ENCOUNTER — Other Ambulatory Visit: Payer: Self-pay

## 2023-08-01 ENCOUNTER — Emergency Department (HOSPITAL_COMMUNITY)

## 2023-08-01 ENCOUNTER — Emergency Department (HOSPITAL_COMMUNITY)
Admission: EM | Admit: 2023-08-01 | Discharge: 2023-08-02 | Disposition: A | Discharge status: Home / Self Care | Attending: Emergency Medicine | Admitting: Emergency Medicine

## 2023-08-01 ENCOUNTER — Encounter (HOSPITAL_COMMUNITY): Payer: Self-pay | Admitting: Emergency Medicine

## 2023-08-01 DIAGNOSIS — R030 Elevated blood-pressure reading, without diagnosis of hypertension: Secondary | ICD-10-CM | POA: Insufficient documentation

## 2023-08-01 DIAGNOSIS — R519 Headache, unspecified: Secondary | ICD-10-CM | POA: Diagnosis not present

## 2023-08-01 DIAGNOSIS — I160 Hypertensive urgency: Secondary | ICD-10-CM

## 2023-08-01 DIAGNOSIS — E876 Hypokalemia: Secondary | ICD-10-CM | POA: Diagnosis not present

## 2023-08-01 NOTE — ED Provider Notes (Signed)
 [place] EMERGENCY DEPARTMENT AT [facility] Provider Note   CSN: [id number] Arrival date & time: 08/01/23  1952     History  Chief Complaint  Patient presents with   Hypertension    Renee Atkinson is a 58 y.o. female.  Patient is a 58 year old female with past medical history of Crohn's disease, GERD.  Patient presenting today for evaluation of elevated blood pressure.  Patient was diagnosed with a sinus infection a week and a half ago.  She was prescribed Augmentin, but stopped taking this after a few days due to sweating.  She then checked her blood pressure and it has been elevated for the past several days.  She is getting numbers up to 170 systolic and 100 diastolic.  She reports having headaches.  No chest pain, difficulty breathing, or abdominal pain.  She denies prior history of hypertension.       Home Medications Prior to Admission medications   Medication Sig Start Date End Date Taking? Authorizing Provider  acetaminophen (TYLENOL) 500 MG tablet Take 500 mg by mouth every 6 (six) hours as needed.    [provider]  cholestyramine light (PREVALITE) 4 g packet TAKE FOUR GRAMS(ONE PACKET) ONCE DAILY AS DIRECTED- PACKAGE LASTS APPROXIMATELY 60 DAYS 06/28/23   Quentin Mulling R, PA-C  cholestyramine light (PREVALITE) 4 GM/DOSE powder TAKE FOUR GRAMS ONCE DAILY AS DIRECTED - CONTAINER LASTS APPROXIMATELY 60 DAYS 06/04/22   Doree Albee, PA-C  clonazePAM (KLONOPIN) 1 MG tablet TAKE 1 TO 1 & 1/2 TABLETS TWICE DAILY AS NEEDED 08/05/16   [provider]  diphenoxylate-atropine (LOMOTIL) 2.5-0.025 MG tablet Take 1 tablet by mouth 4 (four) times daily. 03/05/20   Arnaldo Natal, NP  fluticasone (FLONASE) 50 MCG/ACT nasal spray Place 1 spray into both nostrils daily.  07/20/16   [provider]  ondansetron (ZOFRAN) 4 MG tablet TAKE 1 TO 2 TABLETS BY MOUTH EVERY 8 HOURS AS NEEDED FOR NAUSEA 11/15/19   Pyrtle, Carie Caddy, MD  pantoprazole  (PROTONIX) 40 MG tablet Take 40 mg by mouth daily.    [provider]  traZODone (DESYREL) 150 MG tablet Take 150 mg by mouth at bedtime. 07/28/21   [provider]  ustekinumab (STELARA) 90 MG/ML SOSY injection MAINTENANCE: INJECT 1 SYRINGE SUBCUTANEOUSLY EVERY 8 WEEKS. REFRIGERATE. DO NOT FREEZE. 11/23/22   Pyrtle, Carie Caddy, MD  venlafaxine (EFFEXOR) 100 MG tablet Take 100 mg by mouth 2 (two) times daily.    [provider]      Allergies    Dilaudid [hydromorphone hcl], Humira [adalimumab], Morphine and codeine, Benadryl [diphenhydramine], and Remicade [infliximab]    Review of Systems   Review of Systems  All other systems reviewed and are negative.   Physical Exam Updated Vital Signs BP (!) 172/102   Pulse 75   Temp 98 F (36.7 C) (Oral)   Resp 15   Ht 5\' 4"  (1.626 m)   Wt 68.5 kg   SpO2 98%   BMI 25.92 kg/m  Physical Exam Vitals and nursing note reviewed.  Constitutional:      General: She is not in acute distress.    Appearance: She is well-developed. She is not diaphoretic.  HENT:     Head: Normocephalic and atraumatic.  Eyes:     Extraocular Movements: Extraocular movements intact.     Pupils: Pupils are equal, round, and reactive to light.  Cardiovascular:     Rate and Rhythm: Normal rate and regular rhythm.  Heart sounds: No murmur heard.    No friction rub. No gallop.  Pulmonary:     Effort: Pulmonary effort is normal. No respiratory distress.     Breath sounds: Normal breath sounds. No wheezing.  Abdominal:     General: Bowel sounds are normal. There is no distension.     Palpations: Abdomen is soft.     Tenderness: There is no abdominal tenderness.  Musculoskeletal:        General: Normal range of motion.     Cervical back: Normal range of motion and neck supple.  Skin:    General: Skin is warm and dry.  Neurological:     General: No focal deficit present.     Mental Status: She is alert and oriented to person, place, and  time.     Cranial Nerves: No cranial nerve deficit.     Motor: No weakness.     ED Results / Procedures / Treatments   Labs (all labs ordered are listed, but only abnormal results are displayed) Labs Reviewed - No data to display  EKG None  Radiology No results found.  Procedures Procedures  {Document cardiac monitor, telemetry assessment procedure when appropriate:1}  Medications Ordered in ED Medications - No data to display  ED Course/ Medical Decision Making/ A&P   {   Click here for ABCD2, HEART and other calculatorsREFRESH Note before signing :1}                              Medical Decision Making Amount and/or Complexity of Data Reviewed Labs: ordered. Radiology: ordered.   ***  {Document critical care time when appropriate:1} {Document review of labs and clinical decision tools ie heart score, Chads2Vasc2 etc:1}  {Document your independent review of radiology images, and any outside records:1} {Document your discussion with family members, caretakers, and with consultants:1} {Document social determinants of health affecting pt's care:1} {Document your decision making why or why not admission, treatments were needed:1} Final Clinical Impression(s) / ED Diagnoses Final diagnoses:  None    Rx / DC Orders ED Discharge Orders     None

## 2023-08-01 NOTE — ED Triage Notes (Signed)
 Pt arrived to ED via POV c/o continued hypertension that she started to notice for the past 2 weeks. Pt also c/o constantly sweating since she had a sinus infection 2 weeks ago.

## 2023-08-02 LAB — BASIC METABOLIC PANEL WITH GFR
Anion gap: 11 (ref 5–15)
BUN: 11 mg/dL (ref 6–20)
CO2: 24 mmol/L (ref 22–32)
Calcium: 9.4 mg/dL (ref 8.9–10.3)
Chloride: 103 mmol/L (ref 98–111)
Creatinine, Ser: 0.82 mg/dL (ref 0.44–1.00)
GFR, Estimated: >60 mL/min (ref >60)
Glucose, Bld: 89 mg/dL (ref 70–99)
Potassium: 2.7 mmol/L — CL (ref 3.5–5.1)
Sodium: 138 mmol/L (ref 135–145)

## 2023-08-02 LAB — CBC WITH DIFFERENTIAL/PLATELET
Abs Immature Granulocytes: 0.03 10*3/uL (ref 0.00–0.07)
Basophils Absolute: 0.1 10*3/uL (ref 0.0–0.1)
Basophils Relative: 1 %
Eosinophils Absolute: 0.2 10*3/uL (ref 0.0–0.5)
Eosinophils Relative: 2 %
HCT: 42.5 % (ref 36.0–46.0)
Hemoglobin: 14 g/dL (ref 12.0–15.0)
Immature Granulocytes: 0 %
Lymphocytes Relative: 45 %
Lymphs Abs: 4.8 10*3/uL — ABNORMAL HIGH (ref 0.7–4.0)
MCH: 29.7 pg (ref 26.0–34.0)
MCHC: 32.9 g/dL (ref 30.0–36.0)
MCV: 90.2 fL (ref 80.0–100.0)
Monocytes Absolute: 0.8 10*3/uL (ref 0.1–1.0)
Monocytes Relative: 7 %
Neutro Abs: 4.9 10*3/uL (ref 1.7–7.7)
Neutrophils Relative %: 45 %
Platelets: 273 10*3/uL (ref 150–400)
RBC: 4.71 MIL/uL (ref 3.87–5.11)
RDW: 13.6 % (ref 11.5–15.5)
WBC: 10.8 10*3/uL — ABNORMAL HIGH (ref 4.0–10.5)
nRBC: 0 % (ref 0.0–0.2)

## 2023-08-02 MED ORDER — POTASSIUM CHLORIDE CRYS ER 20 MEQ PO TBCR: 20.0000 meq | EXTENDED_RELEASE_TABLET | Freq: Two times a day (BID) | ORAL | 0 refills | Status: AC

## 2023-08-02 MED ORDER — POTASSIUM CHLORIDE CRYS ER 20 MEQ PO TBCR: 20.0000 meq | EXTENDED_RELEASE_TABLET | Freq: Two times a day (BID) | ORAL | 0 refills | Status: DC

## 2023-08-02 NOTE — ED Notes (Signed)
 Date and time results received: 08/02/23 0053 (use smartphrase ".now" to insert current time)  Test: Potassium Critical Value: 2.7  Name of Provider Notified: Brandon Melnick, MD

## 2023-08-02 NOTE — Discharge Instructions (Signed)
 Begin taking potassium as prescribed.  Keep a record of your blood pressures at home and follow-up with your primary doctor to discuss the results.

## 2023-08-02 NOTE — ED Notes (Signed)
 Pt pharmacy changed and medication resent as requested by pt at discharge to [facility] in [place]

## 2023-08-15 ENCOUNTER — Telehealth: Payer: Self-pay

## 2023-08-15 NOTE — Telephone Encounter (Signed)
 Pharmacy Patient Advocate Encounter   Received notification from  Patient Insurance  that prior authorization for Stelara  90 MG/MLis required/requested.   Insurance verification completed.   The patient is insured through [facility] .   Per test claim: Prior Authorization form/request asks a question that requires your assistance. Please see the question below and advise accordingly. The PA will not be submitted until the necessary information is received.

## 2023-08-16 NOTE — Telephone Encounter (Signed)
 Left detailed message for pt to call back to schedule an OV to be able to get her med approved.

## 2023-08-18 NOTE — Telephone Encounter (Signed)
Left message for pt to call back to schedule an appt 

## 2023-08-24 NOTE — Telephone Encounter (Signed)
 Pt is scheduled to see Deanna May NP 09/13/23@3pm .

## 2023-09-13 ENCOUNTER — Other Ambulatory Visit (INDEPENDENT_AMBULATORY_CARE_PROVIDER_SITE_OTHER)

## 2023-09-13 ENCOUNTER — Ambulatory Visit (INDEPENDENT_AMBULATORY_CARE_PROVIDER_SITE_OTHER): Admitting: Gastroenterology

## 2023-09-13 ENCOUNTER — Encounter: Payer: Self-pay | Admitting: Gastroenterology

## 2023-09-13 VITALS — BP 122/80 | HR 60 | Ht 64.0 in | Wt 139.0 lb

## 2023-09-13 DIAGNOSIS — K5000 Crohn's disease of small intestine without complications: Secondary | ICD-10-CM

## 2023-09-13 DIAGNOSIS — Z79899 Other long term (current) drug therapy: Secondary | ICD-10-CM

## 2023-09-13 LAB — CBC WITH DIFFERENTIAL/PLATELET
Basophils Absolute: 0.1 10*3/uL (ref 0.0–0.1)
Basophils Relative: 0.9 % (ref 0.0–3.0)
Eosinophils Absolute: 0.2 10*3/uL (ref 0.0–0.7)
Eosinophils Relative: 2.5 % (ref 0.0–5.0)
HCT: 40.1 % (ref 36.0–46.0)
Hemoglobin: 13.1 g/dL (ref 12.0–15.0)
Lymphocytes Relative: 39.9 % (ref 12.0–46.0)
Lymphs Abs: 3 10*3/uL (ref 0.7–4.0)
MCHC: 32.7 g/dL (ref 30.0–36.0)
MCV: 92.4 fl (ref 78.0–100.0)
Monocytes Absolute: 0.5 10*3/uL (ref 0.1–1.0)
Monocytes Relative: 6.2 % (ref 3.0–12.0)
Neutro Abs: 3.8 10*3/uL (ref 1.4–7.7)
Neutrophils Relative %: 50.5 % (ref 43.0–77.0)
Platelets: 256 10*3/uL (ref 150.0–400.0)
RBC: 4.34 Mil/uL (ref 3.87–5.11)
RDW: 16.2 % — ABNORMAL HIGH (ref 11.5–15.5)
WBC: 7.5 10*3/uL (ref 4.0–10.5)

## 2023-09-13 LAB — COMPREHENSIVE METABOLIC PANEL WITH GFR
ALT: 11 U/L (ref 0–35)
AST: 13 U/L (ref 0–37)
Albumin: 3.9 g/dL (ref 3.5–5.2)
Alkaline Phosphatase: 56 U/L (ref 39–117)
BUN: 15 mg/dL (ref 6–23)
CO2: 32 meq/L (ref 19–32)
Calcium: 9.6 mg/dL (ref 8.4–10.5)
Chloride: 105 meq/L (ref 96–112)
Creatinine, Ser: 0.74 mg/dL (ref 0.40–1.20)
GFR: 89.48 mL/min (ref >60.00)
Glucose, Bld: 86 mg/dL (ref 70–99)
Potassium: 5.2 meq/L — ABNORMAL HIGH (ref 3.5–5.1)
Sodium: 140 meq/L (ref 135–145)
Total Bilirubin: 0.6 mg/dL (ref 0.2–1.2)
Total Protein: 6.1 g/dL (ref 6.0–8.3)

## 2023-09-13 LAB — IBC + FERRITIN
Ferritin: 10.8 ng/mL (ref 10.0–291.0)
Iron: 50 ug/dL (ref 42–145)
Saturation Ratios: 11.8 % — ABNORMAL LOW (ref 20.0–50.0)
TIBC: 424.2 ug/dL (ref 250.0–450.0)
Transferrin: 303 mg/dL (ref 212.0–360.0)

## 2023-09-13 LAB — C-REACTIVE PROTEIN: CRP: <1 mg/dL (ref 0.5–20.0)

## 2023-09-13 LAB — SEDIMENTATION RATE: Sed Rate: 3 mm/h (ref 0–30)

## 2023-09-13 LAB — VITAMIN D 25 HYDROXY (VIT D DEFICIENCY, FRACTURES): VITD: 29.96 ng/mL — ABNORMAL LOW (ref 30.00–100.00)

## 2023-09-13 LAB — B12 AND FOLATE PANEL
Folate: 6.2 ng/mL (ref >5.9)
Vitamin B-12: 658 pg/mL (ref 211–911)

## 2023-09-13 NOTE — Addendum Note (Signed)
 Addended by: Sarajane Cumming on: 09/13/2023 04:04 PM   Modules accepted: Orders

## 2023-09-13 NOTE — Patient Instructions (Addendum)
 Continue Stelara  injections every 8 weeks Stay up to date on immunizations Stay up to date on eye exams- once a year see ophthalmologist  Stay up to date on skin exams - see dermatologist once a year  Your provider has requested that you go to the basement level for lab work before leaving today. Press "B" on the elevator. The lab is located at the first door on the left as you exit the elevator.  _______________________________________________________  If your blood pressure at your visit was 140/90 or greater, please contact your primary care physician to follow up on this.  _______________________________________________________  If you are age 1 or older, your body mass index should be between 23-30. Your Body mass index is 23.86 kg/m. If this is out of the aforementioned range listed, please consider follow up with your Primary Care Provider.  If you are age 40 or younger, your body mass index should be between 19-25. Your Body mass index is 23.86 kg/m. If this is out of the aformentioned range listed, please consider follow up with your Primary Care Provider.   ________________________________________________________  The [place] [facility] providers would like to encourage you to use MYCHART to communicate with providers for non-urgent requests or questions.  Due to long hold times on the telephone, sending your provider a message by [facility] may be a faster and more efficient way to get a response.  Please allow 48 business hours for a response.  Please remember that this is for non-urgent requests.  _______________________________________________________;  Thank you for trusting me with your gastrointestinal care. Deanna May, RNP

## 2023-09-13 NOTE — Progress Notes (Signed)
 Chief Complaint:follow-up Crohn's disease, med refill Primary GI Doctor:Dr. Bridgett Camps  HPI:  Patient is a  58  year old female patient with past medical history of Ileal Crohn's disease with history of stricture diagnosed in 2015, s/p ileocecectomy 02/2017 on Stelera since 05/2017 and GERD who presents for follow-up.  Last seen in GI office by Dr. Bridgett Camps on 11/11/21 for colonoscopy. Impression:  - Patent end- to- side ileo- colonic anastomosis, characterized by healthy appearing mucosa.  - Simple Endoscopic Score for Crohn' s Disease: 3, very mild inflammatory changes in the neo- terminal ileum ( scattered ulcers without narrowing) secondary to Crohn' s disease. Biopsied. Colon normal without evidence of colitis.  - The examination was otherwise normal on direct and retroflexion views.  08/01/23 ED visit for elevated blood pressure. Patient was diagnosed with a sinus infection a week and a half ago. She was prescribed Augmentin , but stopped taking this after a few days due to sweating. She then checked her blood pressure and it has been elevated for the past several days.  Laboratory studies obtained including CBC and basic metabolic panel. Potassium low at 2.7, but studies otherwise unremarkable.   IBD history:Diagnosed 2015  Surgical history: Ileocecectomy in 02/2017 secondary to strictures.  Current medications and last dose:  Prior medications: -Stelara  since 05/2017 q 8 weeks, was supposed to have Estrellita Lasky 4th but could not get medication, last one was 8 weeks prior to that, needs PA.  -Questran  4 g daily as needed, takes 1-2 x a week. Has not been on for 3 weeks.  -Lomotil  1-2 times weekly not on lately Last colonoscopy: 02/08/2016, not on medication at that time? 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 Last small bowel imaging: CT AB and pelvis 07/2016 showed mid through distal ileum wall thickening then had ileocectomy 02/2017 Extraintestinal manifestations:  The patient has not had any extraintestinal symptoms   Interval History    Patient presents for follow-up on Crohn;s disease and currently on Stelara  every 8 weeks. She is due for next injection first week of June. Patient denies abdominal cramping, diarrhea, and rectal bleeding. She uses OTC lomotil  and cholestyramine  prn for periods of stress diarrhea with IBS-D. She states her diet also helps.     She denies extraintestinal manifestations.She no longer requires reflux medications for GERD.     No new medical issues.  Wt Readings from Last 3 Encounters:  09/13/23 139 lb (63 kg)  08/01/23 151 lb 0.2 oz (68.5 kg)  11/11/21 151 lb (68.5 kg)   Past Medical History:  Diagnosis Date   Anxiety    Atypical nevus 12/19/2003   SLIGHT-MODERATE LEFT ABDOMEN- NO TX CLEAR   Atypical nevus 12/19/2003   SLIGHT-MODERATE- RIGHT UPPER BACK- NO TX CLEAR   Atypical nevus 11/12/2007   MODERATE- CENTER MID BACK- NO TX CLEAR   Crohn's disease of ileum (HCC) OCT 2014   Depression    Diverticulosis    Gastric outlet obstruction    GERD (gastroesophageal reflux disease)    Hiatal hernia    Insomnia    Malignant melanoma in situ (HCC) 05/19/2016   RIGHT LOWER ABDOMEN- TX WITH EXCISION   Peptic ulcer disease    Schatzki's ring    SVD (spontaneous vaginal delivery)    x 2    Past Surgical History:  Procedure Laterality Date   ABDOMINAL HYSTERECTOMY N/A 04/02/2013   Procedure: HYSTERECTOMY ABDOMINAL;  Surgeon: Carvel Clarity, MD;  Location: [facility];  Service: Gynecology;  Laterality: N/A;  BIOPSY N/A 05/14/2013   Procedure: BIOPSY;  Surgeon: Alyce Jubilee, MD;  Location: [facility];  Service: Endoscopy;  Laterality: N/A;  biopsies of the ileum, random colon biopsies & rectal biopsies   CHOLECYSTECTOMY     COLONOSCOPY N/A 03/18/2013   Dr. Nolene Baumgarten: ileitis, ulcer at ICV, mild colitis in ascending colon and sigmoid colon, moderate sized internal hemorrhoids. bx inconclusive either related to crohns or  NSAIDs   COLONOSCOPY WITH PROPOFOL  N/A 05/14/2013   SLF:Non-bleeding mucosal ulceration in the terminal ileum and at the ileocecal valve MOST LIKELY DUE TO CROHN'S DISEASE/Normal colon/Normal mucosa in RECTUM. Bx from TI, chronic active ileitis ?crohn's or NSAIDs. random colon and rectum bx negative.   LAPAROSCOPIC ILEOCECECTOMY  03/09/2017   SALPINGOOPHORECTOMY Bilateral 04/02/2013   Procedure: SALPINGO OOPHORECTOMY;  Surgeon: Carvel Clarity, MD;  Location: [facility];  Service: Gynecology;  Laterality: Bilateral;   TUBAL LIGATION      Current Outpatient Medications  Medication Sig Dispense Refill   acetaminophen  (TYLENOL ) 500 MG tablet Take 500 mg by mouth every 6 (six) hours as needed.     cholestyramine  light (PREVALITE ) 4 g packet TAKE FOUR GRAMS(ONE PACKET) ONCE DAILY AS DIRECTED- PACKAGE LASTS APPROXIMATELY 60 DAYS 60 each 0   cholestyramine  light (PREVALITE ) 4 GM/DOSE powder TAKE FOUR GRAMS ONCE DAILY AS DIRECTED - CONTAINER LASTS APPROXIMATELY 60 DAYS 231 g 3   clonazePAM  (KLONOPIN ) 1 MG tablet TAKE 1 TO 1 & 1/2 TABLETS TWICE DAILY AS NEEDED  2   diphenoxylate -atropine  (LOMOTIL ) 2.5-0.025 MG tablet Take 1 tablet by mouth 4 (four) times daily. 120 tablet 2   fluticasone  (FLONASE ) 50 MCG/ACT nasal spray Place 1 spray into both nostrils daily.      ondansetron  (ZOFRAN ) 4 MG tablet TAKE 1 TO 2 TABLETS BY MOUTH EVERY 8 HOURS AS NEEDED FOR NAUSEA 60 tablet 0   ustekinumab  (STELARA ) 90 MG/ML SOSY injection MAINTENANCE: INJECT 1 SYRINGE SUBCUTANEOUSLY EVERY 8 WEEKS. REFRIGERATE. DO NOT FREEZE. 1 mL 8   pantoprazole  (PROTONIX ) 40 MG tablet Take 40 mg by mouth daily. (Patient not taking: Reported on 09/13/2023)     potassium chloride  SA (KLOR-CON  M) 20 MEQ tablet Take 1 tablet (20 mEq total) by mouth 2 (two) times daily. (Patient not taking: Reported on 09/13/2023) 20 tablet 0   traZODone (DESYREL) 150 MG tablet Take 150 mg by mouth at bedtime. (Patient not taking: Reported on 09/13/2023)      venlafaxine (EFFEXOR) 100 MG tablet Take 100 mg by mouth 2 (two) times daily. (Patient not taking: Reported on 09/13/2023)     Current Facility-Administered Medications  Medication Dose Route Frequency Provider Last Rate Last Admin   0.9 %  sodium chloride  infusion  500 mL Intravenous Continuous Pyrtle, Amber Bail, MD       0.9 %  sodium chloride  infusion  500 mL Intravenous Continuous Pyrtle, Amber Bail, MD       acetaminophen  (TYLENOL ) tablet 650 mg  650 mg Oral Once Pyrtle, Amber Bail, MD       loratadine  (CLARITIN ) tablet 10 mg  10 mg Oral Once Nannette Babe, MD        Allergies as of 09/13/2023 - Review Complete 09/13/2023  Allergen Reaction Noted   Dilaudid  [hydromorphone  hcl] Other (See Comments) 03/13/2013   Humira [adalimumab]  11/07/2013   Morphine  and codeine Nausea And Vomiting 02/14/2013   Benadryl  [diphenhydramine ] Anxiety 05/03/2016   Remicade  [infliximab ] Rash 06/03/2016    Family History  Problem Relation Age of Onset  Prostate cancer Father    Diverticulitis Mother        complicated, colostomy bag temporarily   Irritable bowel syndrome Mother    Inflammatory bowel disease Neg Hx    Colon cancer Neg Hx    Stomach cancer Neg Hx     Review of Systems:    Constitutional: No weight loss, fever, chills, weakness or fatigue HEENT: Eyes: No change in vision               Ears, Nose, Throat:  No change in hearing or congestion Skin: No rash or itching Cardiovascular: No chest pain, chest pressure or palpitations   Respiratory: No SOB or cough Gastrointestinal: See HPI and otherwise negative Genitourinary: No dysuria or change in urinary frequency Neurological: No headache, dizziness or syncope Musculoskeletal: No new muscle or joint pain Hematologic: No bleeding or bruising Psychiatric: No history of depression or anxiety    Physical Exam:  Vital signs: BP 122/80   Pulse 60   Ht 5\' 4"  (1.626 m)   Wt 139 lb (63 kg)   BMI 23.86 kg/m   Constitutional:   Pleasant  female  appears to be in NAD, Well developed, Well nourished, alert and cooperative Throat: Oral cavity and pharynx without inflammation, swelling or lesion.  Respiratory: Respirations even and unlabored. Lungs clear to auscultation bilaterally.   No wheezes, crackles, or rhonchi.  Cardiovascular: Normal S1, S2. Regular rate and rhythm. +1 edema to lower extremity Gastrointestinal:  Soft, nondistended, nontender. No rebound or guarding. Normal bowel sounds. No appreciable masses or hepatomegaly. Rectal:  Not performed.  Msk:  Symmetrical without gross deformities. Without edema, no deformity or joint abnormality.  Neurologic:  Alert and  oriented x4;  grossly normal neurologically.  Skin:   Dry and intact without significant lesions or rashes. Psychiatric: Oriented to person, place and time. Demonstrates good judgement and reason without abnormal affect or behaviors.  RELEVANT LABS AND IMAGING: CBC    Latest Ref Rng & Units 08/01/2023   11:52 PM 09/07/2021   10:12 AM 08/13/2019    3:57 PM  CBC  WBC 4.0 - 10.5 K/uL 10.8  9.7  8.6   Hemoglobin 12.0 - 15.0 g/dL 16.1  09.6  04.5   Hematocrit 36.0 - 46.0 % 42.5  41.3  41.6   Platelets 150 - 400 K/uL 273  259.0  223.0      CMP     Latest Ref Rng & Units 08/01/2023   11:52 PM 09/07/2021   10:12 AM 08/13/2019    3:57 PM  CMP  Glucose 70 - 99 mg/dL 89  73  83   BUN 6 - 20 mg/dL 11  11  8    Creatinine 0.44 - 1.00 mg/dL 4.09  8.11  9.14   Sodium 135 - 145 mmol/L 138  141  137   Potassium 3.5 - 5.1 mmol/L 2.7  3.6  3.7   Chloride 98 - 111 mmol/L 103  104  103   CO2 22 - 32 mmol/L 24  29  30    Calcium 8.9 - 10.3 mg/dL 9.4  9.6  9.5   Total Protein 6.0 - 8.3 g/dL  6.4  6.4   Total Bilirubin 0.2 - 1.2 mg/dL  0.4  0.6   Alkaline Phos 39 - 117 U/L  54  44   AST 0 - 37 U/L  15  13   ALT 0 - 35 U/L  13  10      Lab Results  Component Value Date   TSH 2.60 02/22/2018   Upper GI series 05/16/2016: 1. Peripyloric high-grade stricture, as on recent  endoscopy. 2. Slight irregularity of the terminal ileum, consistent with the given history of Crohn disease. 3. Failure to adequately distend the ileocecal junction. Difficult to exclude a stricture.   EGD 04/28/2016:  - Normal mucosa was found in the entire esophagus. - Widely patent Schatzki ring. - Gastric stenosis was found at the pylorus unable to be traversed with the standard adult upper endoscope. The previously seen pyloric ulcer was not present.    EGD 02/08/2016: - Widely patent Schatzki ring. - 2 cm hiatal hernia. - Gastritis in the pre-pyloric antrum with pyloric channel ulcer causing narrowing at the pylorus (unable to traverse today).  Biopsies showed benign reactive antral gastric mucosa, chronic active gastritis without evidence of Helicobacter pylori.  No dysplasia.   Colonoscopy 02/08/2016: - Crohn's disease with ileitis. Biopsied. 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 - Mild diverticulosis in the sigmoid colon. - Normal mucosa in the entire examined colon without evidence of colitis. 1. Surgical [P], pylorus/pyloric channel ulcer - BENIGN REACTIVE ANTRAL GASTRIC MUCOSA SHOWING CHRONIC, FOCALLY ACTIVE GASTRITIS. - WARTHIN-STARRY STAIN IS NEGATIVE FOR HELICOBACTER PYLORI. - NO INTESTINAL METAPLASIA, DYSPLASIA, OR MALIGNANCY. - SEE COMMENT. 2. Surgical [P], terminal ileum ulcer - BENIGN SMALL BOWEL MUCOSA WITH ACTIVE ILEITIS. - NO DYSPLASIA OR MALIGNANCY IDENTIFIED. - SEE COMMENT. 3. Surgical [P], ascending - BENIGN POLYPOID COLORECTAL MUCOSA WITH ASSOCIATED PROMINENT BENIGN LYMPHOID AGGREGATE. - NO EVIDENCE OF SIGNIFICANT INFLAMMATION, DYSPLASIA OR MALIGNANCY. - SEE COMMENT.  11/11/21 for colonoscopy, recall 5 years Impression:  - Patent end- to- side ileo- colonic anastomosis, characterized by healthy appearing mucosa.  - Simple Endoscopic Score for Crohn' s Disease: 3, very mild inflammatory changes in the neo- terminal ileum ( scattered ulcers  without narrowing) secondary to Crohn' s disease. Biopsied. Colon normal without evidence of colitis.  - The examination was otherwise normal on direct and retroflexion views.  Assessment: Encounter Diagnosis  Name Primary?   Crohn's disease of small intestine without complication [facility]) Yes      58 year old female patient with history of Crohn's disease who has responded well to Stelara  90 mg every 8 wks. Will order annual lab work to evaluate vitamin levels as well as inflammatory markers. Last colon 7/23 showed mild inflammation. Patient also has IBS-D which is managed with prn antidiarrheals and diet. No new issues. Plan: -Continue Stelara  injections every 8 weeks -check CBC, CMP, CRP, ESR, TB Quant, Vitamin B 12, IBC panel, folate, and vitamin D  -Stay up to date on eye exams- once a year see ophthalmologist  -Stay up to date on skin exams - see dermatologist once a year - 1 year follow-up   Thank you for the courtesy of this consult. Please call me with any questions or concerns.   Taetum Flewellen, FNP-C [facility] 09/13/2023, 3:36 PM  Cc: Medicine, [facility] Internal

## 2023-09-14 ENCOUNTER — Other Ambulatory Visit (HOSPITAL_COMMUNITY): Payer: Self-pay

## 2023-09-14 ENCOUNTER — Telehealth: Payer: Self-pay | Admitting: Nurse Practitioner

## 2023-09-14 ENCOUNTER — Other Ambulatory Visit: Payer: Self-pay

## 2023-09-14 ENCOUNTER — Telehealth: Payer: Self-pay | Admitting: Physician Assistant

## 2023-09-14 ENCOUNTER — Telehealth: Payer: Self-pay | Admitting: Gastroenterology

## 2023-09-14 ENCOUNTER — Ambulatory Visit: Payer: Self-pay | Admitting: Gastroenterology

## 2023-09-14 ENCOUNTER — Telehealth: Payer: Self-pay

## 2023-09-14 MED ORDER — USTEKINUMAB 90 MG/ML ~~LOC~~ SOSY: 90.0000 mg | PREFILLED_SYRINGE | SUBCUTANEOUS | 6 refills | Status: AC

## 2023-09-14 NOTE — Progress Notes (Signed)
 Addendum: Reviewed and agree with assessment and management plan. Asha Grumbine, Carie Caddy, MD

## 2023-09-14 NOTE — Telephone Encounter (Signed)
 Patient called and sated that she was seen by [facility] May yesterday. Patient is requesting a refill on her medication Prevalite  4 GM. Patient stated that her current drug store on file has closed and this medication needs to go to [facility] on [address] [place] [address] and there contact number is [phone]. Please advise.

## 2023-09-14 NOTE — Telephone Encounter (Signed)
 Pharmacy Patient Advocate Encounter   Received notification from Pt Calls Messages that prior authorization for Stelara  90MG /ML syringes is required/requested.   Insurance verification completed.   The patient is insured through [facility] .   Per test claim: PA required; PA submitted to above mentioned insurance via CoverMyMeds Key/confirmation #/EOC [employer] Status is pending

## 2023-09-14 NOTE — Telephone Encounter (Signed)
 Patient called and stated that she was seen by [facility] May yesterday and was needing a refill on her medication Lomotil . Patient current pharmacy has closed and patient is wanting this medication to be sent over to [facility] on [address], [place], [place] [address]. Best contact number for them is  [phone]. Please advise.

## 2023-09-14 NOTE — Telephone Encounter (Signed)
 Patient called and stated that she was seen yesterday by the PA Deanna May. Patient is wanting to know if she is needing to speak to the nurse regarding refilling her Stelara . Patient is requesting a call back. Please advise.

## 2023-09-15 NOTE — Telephone Encounter (Signed)
 Pharmacy Patient Advocate Encounter  Received notification from [facility] that Prior Authorization for Stelara  90MG /ML syringes has been APPROVED from 09-14-2023 to 09-13-2024   PA #/Case ID/Reference #: [id number]

## 2023-09-15 NOTE — Telephone Encounter (Signed)
 Pt made aware. Pt verbalized understanding with all questions answered.

## 2023-09-17 LAB — QUANTIFERON-TB GOLD PLUS
Mitogen-NIL: 7.67 [IU]/mL
NIL: 0.01 [IU]/mL
QuantiFERON-TB Gold Plus: NEGATIVE
TB1-NIL: 0 [IU]/mL
TB2-NIL: 0 [IU]/mL

## 2023-09-19 ENCOUNTER — Other Ambulatory Visit: Payer: Self-pay | Admitting: Gastroenterology

## 2023-09-19 DIAGNOSIS — K591 Functional diarrhea: Secondary | ICD-10-CM

## 2023-09-19 MED ORDER — DIPHENOXYLATE-ATROPINE 2.5-0.025 MG PO TABS: 1.0000 | ORAL_TABLET | Freq: Four times a day (QID) | ORAL | 2 refills | Status: DC

## 2023-09-19 MED ORDER — CHOLESTYRAMINE LIGHT 4 G PO PACK: 4.0000 g | PACK | Freq: Every day | ORAL | 0 refills | Status: DC

## 2023-09-19 NOTE — Progress Notes (Signed)
 Refills sent to [facility] as requested form patient

## 2023-09-19 NOTE — Telephone Encounter (Signed)
 Inbound call from patient requesting a call. States pharmacy still has not received medication. Please advise, thank you

## 2023-09-20 ENCOUNTER — Other Ambulatory Visit: Payer: Self-pay

## 2023-09-20 DIAGNOSIS — K591 Functional diarrhea: Secondary | ICD-10-CM

## 2023-09-20 MED ORDER — DIPHENOXYLATE-ATROPINE 2.5-0.025 MG PO TABS: 1.0000 | ORAL_TABLET | Freq: Four times a day (QID) | ORAL | 2 refills | Status: AC

## 2023-09-20 MED ORDER — CHOLESTYRAMINE LIGHT 4 G PO PACK: 4.0000 g | PACK | Freq: Every day | ORAL | 0 refills | Status: DC

## 2023-09-20 NOTE — Progress Notes (Signed)
 Medication refill

## 2023-10-09 ENCOUNTER — Other Ambulatory Visit: Payer: Self-pay | Admitting: Physician Assistant

## 2023-10-09 DIAGNOSIS — K591 Functional diarrhea: Secondary | ICD-10-CM

## 2024-01-02 ENCOUNTER — Telehealth: Payer: Self-pay | Admitting: Gastroenterology

## 2024-01-02 DIAGNOSIS — K591 Functional diarrhea: Secondary | ICD-10-CM

## 2024-01-02 NOTE — Telephone Encounter (Signed)
 Received a call from patient regarding medication refill of Prevalite  4g packets, patient states her new pharmacy is [facility] ([address], [place], [place]). Please review and advise   Thank you

## 2024-01-03 MED ORDER — CHOLESTYRAMINE LIGHT 4 G PO PACK: 4.0000 g | PACK | Freq: Every day | ORAL | 0 refills | Status: DC

## 2024-01-03 MED ORDER — CHOLESTYRAMINE LIGHT 4 G PO PACK: 4.0000 g | PACK | Freq: Every day | ORAL | 0 refills | Status: AC

## 2024-01-03 NOTE — Addendum Note (Signed)
 Addended by: LANETTE ALETHEA CROME on: 01/03/2024 10:05 AM   Modules accepted: Orders
